# Patient Record
Sex: Female | Born: 1935 | Race: Black or African American | Hispanic: No
Health system: Southern US, Community
[De-identification: ages and names within clinical notes are randomized; demographics above are authoritative.]

## PROBLEM LIST (undated history)

## (undated) DIAGNOSIS — I63512 Cerebral infarction due to unspecified occlusion or stenosis of left middle cerebral artery: Secondary | ICD-10-CM

## (undated) DIAGNOSIS — G9340 Encephalopathy, unspecified: Secondary | ICD-10-CM

## (undated) DIAGNOSIS — F0390 Unspecified dementia without behavioral disturbance: Secondary | ICD-10-CM

## (undated) DIAGNOSIS — G40909 Epilepsy, unspecified, not intractable, without status epilepticus: Secondary | ICD-10-CM

## (undated) DIAGNOSIS — J9621 Acute and chronic respiratory failure with hypoxia: Secondary | ICD-10-CM

---

## 2020-06-17 ENCOUNTER — Other Ambulatory Visit (HOSPITAL_COMMUNITY): Payer: Medicare Other

## 2020-06-17 ENCOUNTER — Inpatient Hospital Stay
Admission: AD | Admit: 2020-06-17 | Discharge: 2020-07-10 | Disposition: A | Discharge status: Skilled Nursing Facility | Payer: Medicare Other | Source: Other Acute Inpatient Hospital | Attending: Internal Medicine | Admitting: Internal Medicine

## 2020-06-17 DIAGNOSIS — G9340 Encephalopathy, unspecified: Secondary | ICD-10-CM | POA: Diagnosis not present

## 2020-06-17 DIAGNOSIS — I63512 Cerebral infarction due to unspecified occlusion or stenosis of left middle cerebral artery: Secondary | ICD-10-CM | POA: Diagnosis not present

## 2020-06-17 DIAGNOSIS — J9690 Respiratory failure, unspecified, unspecified whether with hypoxia or hypercapnia: Secondary | ICD-10-CM

## 2020-06-17 DIAGNOSIS — F0390 Unspecified dementia without behavioral disturbance: Secondary | ICD-10-CM | POA: Diagnosis present

## 2020-06-17 DIAGNOSIS — G40909 Epilepsy, unspecified, not intractable, without status epilepticus: Secondary | ICD-10-CM

## 2020-06-17 DIAGNOSIS — J9621 Acute and chronic respiratory failure with hypoxia: Secondary | ICD-10-CM | POA: Diagnosis not present

## 2020-06-17 DIAGNOSIS — Z431 Encounter for attention to gastrostomy: Secondary | ICD-10-CM

## 2020-06-17 HISTORY — DX: Epilepsy, unspecified, not intractable, without status epilepticus: G40.909

## 2020-06-17 HISTORY — DX: Encephalopathy, unspecified: G93.40

## 2020-06-17 HISTORY — DX: Cerebral infarction due to unspecified occlusion or stenosis of left middle cerebral artery: I63.512

## 2020-06-17 HISTORY — DX: Acute and chronic respiratory failure with hypoxia: J96.21

## 2020-06-17 HISTORY — DX: Unspecified dementia, unspecified severity, without behavioral disturbance, psychotic disturbance, mood disturbance, and anxiety: F03.90

## 2020-06-17 LAB — BLOOD GAS, ARTERIAL
Acid-Base Excess: 2 mmol/L (ref 0.0–2.0)
Bicarbonate: 25.7 mmol/L (ref 20.0–28.0)
FIO2: 28
O2 Saturation: 98.3 %
Patient temperature: 37
pCO2 arterial: 38.4 mmHg (ref 32.0–48.0)
pH, Arterial: 7.441 (ref 7.350–7.450)
pO2, Arterial: 117 mmHg — ABNORMAL HIGH (ref 83.0–108.0)

## 2020-06-17 MED ORDER — IOHEXOL 300 MG/ML  SOLN
50.0000 mL | Freq: Once | INTRAMUSCULAR | Status: AC | PRN
  Administered 2020-06-17: 50 mL

## 2020-06-18 ENCOUNTER — Encounter: Payer: Self-pay | Admitting: Internal Medicine

## 2020-06-18 DIAGNOSIS — G9340 Encephalopathy, unspecified: Secondary | ICD-10-CM | POA: Diagnosis present

## 2020-06-18 DIAGNOSIS — I63512 Cerebral infarction due to unspecified occlusion or stenosis of left middle cerebral artery: Secondary | ICD-10-CM | POA: Diagnosis not present

## 2020-06-18 DIAGNOSIS — J9621 Acute and chronic respiratory failure with hypoxia: Secondary | ICD-10-CM | POA: Diagnosis not present

## 2020-06-18 DIAGNOSIS — F0390 Unspecified dementia without behavioral disturbance: Secondary | ICD-10-CM | POA: Diagnosis not present

## 2020-06-18 DIAGNOSIS — G40909 Epilepsy, unspecified, not intractable, without status epilepticus: Secondary | ICD-10-CM

## 2020-06-18 LAB — CBC
HCT: 22.3 % — ABNORMAL LOW (ref 36.0–46.0)
Hemoglobin: 7 g/dL — ABNORMAL LOW (ref 12.0–15.0)
MCH: 29.8 pg (ref 26.0–34.0)
MCHC: 31.4 g/dL (ref 30.0–36.0)
MCV: 94.9 fL (ref 80.0–100.0)
Platelets: 394 10*3/uL (ref 150–400)
RBC: 2.35 MIL/uL — ABNORMAL LOW (ref 3.87–5.11)
RDW: 14.8 % (ref 11.5–15.5)
WBC: 6.2 10*3/uL (ref 4.0–10.5)
nRBC: 0 % (ref 0.0–0.2)

## 2020-06-18 LAB — COMPREHENSIVE METABOLIC PANEL
ALT: 21 U/L (ref 0–44)
AST: 32 U/L (ref 15–41)
Albumin: 2 g/dL — ABNORMAL LOW (ref 3.5–5.0)
Alkaline Phosphatase: 47 U/L (ref 38–126)
Anion gap: 9 (ref 5–15)
BUN: 22 mg/dL (ref 8–23)
CO2: 28 mmol/L (ref 22–32)
Calcium: 9 mg/dL (ref 8.9–10.3)
Chloride: 96 mmol/L — ABNORMAL LOW (ref 98–111)
Creatinine, Ser: 0.5 mg/dL (ref 0.44–1.00)
GFR calc Af Amer: >60 mL/min (ref >60)
GFR calc non Af Amer: >60 mL/min (ref >60)
Glucose, Bld: 110 mg/dL — ABNORMAL HIGH (ref 70–99)
Potassium: 4.5 mmol/L (ref 3.5–5.1)
Sodium: 133 mmol/L — ABNORMAL LOW (ref 135–145)
Total Bilirubin: 0.8 mg/dL (ref 0.3–1.2)
Total Protein: 5.5 g/dL — ABNORMAL LOW (ref 6.5–8.1)

## 2020-06-18 LAB — PROTIME-INR
INR: 1.1 (ref 0.8–1.2)
Prothrombin Time: 13.8 seconds (ref 11.4–15.2)

## 2020-06-18 LAB — TSH: TSH: 3.199 u[IU]/mL (ref 0.350–4.500)

## 2020-06-18 NOTE — Progress Notes (Addendum)
Pulmonary Critical Care Medicine [facility] [place]   PULMONARY CRITICAL CARE SERVICE  PROGRESS NOTE  Date of Service: 06/18/2020  Michaelina Blandino  ZOX:[id number]  DOB: [birth year]   DOA: 06/17/2020  Referring Physician: Carron Curie, MD  HPI: Joy Norman is a 86 y.o. female seen for follow up of Acute on Chronic Respiratory Failure.  Patient remains on full support at this time pressure control mode rate of 12 and FiO2 of 28% satting well no fever distress.  Medications: Reviewed on Rounds  Physical Exam:  Vitals: Pulse 67 respirations 19 BP 107/42 O2 sat 90% temp 97  Ventilator Settings ventilator mode AC PC rate of 12 is 20 pressure 17 PEEP of 5 and FiO2 20%  . General: Comfortable at this time . Eyes: Grossly normal lids, irises & conjunctiva . ENT: grossly tongue is normal . Neck: no obvious mass . Cardiovascular: S1 S2 normal no gallop . Respiratory: Coarse breath sounds . Abdomen: soft . Skin: no rash seen on limited exam . Musculoskeletal: not rigid . Psychiatric:unable to assess . Neurologic: no seizure no involuntary movements         Lab Data:   Basic Metabolic Panel: Recent Labs  Lab 06/18/20 0806  NA 133*  K 4.5  CL 96*  CO2 28  GLUCOSE 110*  BUN 22  CREATININE 0.50  CALCIUM 9.0    ABG: Recent Labs  Lab 06/17/20 1345  PHART 7.441  PCO2ART 38.4  PO2ART 117*  HCO3 25.7  O2SAT 98.3    Liver Function Tests: Recent Labs  Lab 06/18/20 0806  AST 32  ALT 21  ALKPHOS 47  BILITOT 0.8  PROT 5.5*  ALBUMIN 2.0*   No results for input(s): LIPASE, AMYLASE in the last 168 hours. No results for input(s): AMMONIA in the last 168 hours.  CBC: Recent Labs  Lab 06/18/20 0806  WBC 6.2  HGB 7.0*  HCT 22.3*  MCV 94.9  PLT 394    Cardiac Enzymes: No results for input(s): CKTOTAL, CKMB, CKMBINDEX, TROPONINI in the last 168 hours.  BNP (last 3 results) No results for input(s): BNP in the last 8760 hours.  ProBNP (last 3  results) No results for input(s): PROBNP in the last 8760 hours.  Radiological Exams: DG ABDOMEN PEG TUBE LOCATION  Result Date: 06/17/2020 CLINICAL DATA:  Status post replacement of a PEG tube. EXAM: ABDOMEN - 1 VIEW COMPARISON:  None. FINDINGS: Contrast injected into the patient's PEG tube opacifies the stomach. IMPRESSION: Peg tube in good position. Electronically Signed   By: Drusilla Kanner M.D.   On: 06/17/2020 15:54   DG CHEST PORT 1 VIEW  Result Date: 06/17/2020 CLINICAL DATA:  Peg tube placement. EXAM: PORTABLE CHEST 1 VIEW COMPARISON:  June 01, 2020 FINDINGS: Mildly decreased lung volumes are seen which is likely secondary to the degree of patient inspiration. The endotracheal tube and nasogastric tube seen on the prior study have been removed. A tracheostomy tube is in place. A right-sided PICC line is noted with its distal end seen at the junction of the superior vena cava and right atrium. There is no evidence of acute infiltrate, pleural effusion or pneumothorax. The heart size and mediastinal contours are within normal limits. There is moderate severity calcification of the aortic arch. The visualized skeletal structures are unremarkable. IMPRESSION: 1. No acute cardiopulmonary disease. 2. Tracheostomy tube and right-sided PICC line in place, as described above. Electronically Signed   By: Aram Candela M.D.   On: 06/17/2020 15:55  Assessment/Plan Active Problems:   Acute on chronic respiratory failure with hypoxia (HCC)   Arterial ischemic stroke, MCA (middle cerebral artery), left, acute (HCC)   Dementia (HCC)   Seizure disorder (HCC)   Acute encephalopathy   1. Acute on chronic respiratory failure with hypoxia at this time patient is on the ventilator and full support. We will continue to attempt weaning at this time. Continue supportive measures and aggressive pulmonary toilet. 2. Left MCA stroke no change we will continue to follow along.  Patient has been  essentially nonverbal nonresponsive. 3. Acute encephalopathy likely related to multiple factors including respiratory failure as well as the stroke.  We will continue with supportive care. 4. Dementia at baseline patient has a history of normal pressure hydrocephalus along with psychotic depression.  Plan is for him to be to continue with supportive care. 5. Seizure disorder no active seizure noted at this time we will continue to monitor   I have personally seen and evaluated the patient, evaluated laboratory and imaging results, formulated the assessment and plan and placed orders. The Patient requires high complexity decision making with multiple systems involvement.  Rounds were done with the Respiratory Therapy Director and Staff therapists and discussed with nursing staff also.  Yevonne Pax, MD [facility] Pulmonary Critical Care Medicine Sleep Medicine

## 2020-06-18 NOTE — Consult Note (Signed)
Pulmonary Critical Care Medicine [facility] [place]  PULMONARY SERVICE  Date of Service: 06/17/2020  PULMONARY CRITICAL CARE Joy Norman  [id number]  DOB: [birth year]   DOA: 06/17/2020  Referring Physician: Carron Curie, MD  HPI: Joy Norman is a 86 y.o. female seen for follow up of Acute on Chronic Respiratory Failure.  Patient has multiple medical problems including hyperlipidemia hypertension depression dementia delusional disorder with psychotic symptoms bradycardia hydrocephalus seizure disorder came into the hospital from a nursing home facility with altered mental status.  The patient had normally been interactive and alert and oriented but became disoriented so they sent her for evaluation.  The patient was admitted to the hospital had respiratory failure had a work-up to evaluate.  Other complications included development of urinary tract infection which was treated.  Patient also was found to have pneumonia which was treated.  Patient was also found to have an acute stroke and was outside the window for TPA so was managed conservatively.  Basically has not been responsive  Review of Systems:  ROS performed and is unremarkable other than noted above.  Past Medical History Past Medical History:  Diagnosis Date  . CA - breast cancer  . Glaucoma  Bilateral  . Hyperlipidemia  . Hypertension  . Hypothyroidism  . Osteoporosis   Past Surgical History Past Surgical History:  Procedure Laterality Date  . ABDOMINAL SURGERY  . Argon Laser Trabeculoplasty OU 2008  . BREAST SURGERY  . CATARACT EXTRACTION  Bilateral  . EYE SURGERY  . HYSTERECTOMY  . Mastectomy, left breast  . TRABECULECTOMY 10/18/2011  Procedure: TRABECULECTOMY; Surgeon: Tilden Dome, MD; Location: [facility] OUTPATIENT OR; Service: Ophthalmology; Laterality: Right;   Family History Family History  Problem Relation Age of Onset  . Glaucoma Mother  "blindness due to it"  . Cataracts  Mother  . Cataracts Sister  . Heart attack Sister  . Hypertension Sister   Social History Social History   Socioeconomic History  . Marital status: Widowed  Spouse name: Not on file  . Number of children: Not on file  . Years of education: Not on file  . Highest education level: Not on file  Occupational History  . Not on file  Tobacco Use  . Smoking status: Never Smoker  . Smokeless tobacco: Never Used  Substance and Sexual Activity  . Alcohol use: No  . Drug use: No  . Sexual activity: Not on file  Other Topics Concern  . Not on file  Social History Narrative  . Not on file   Medications: Reviewed on Rounds  Physical Exam:  Vitals: Temperature is 98.0 pulse 75 respiratory 14 blood pressure is 143/74 saturations 94%  Ventilator Settings on pressure assist control FiO2 28% tidal volume 457 IP 17 PEEP 5  . General: Comfortable at this time . Eyes: Grossly normal lids, irises & conjunctiva . ENT: grossly tongue is normal . Neck: no obvious mass . Cardiovascular: S1-S2 normal no gallop or rub . Respiratory: No rhonchi very coarse breath sounds . Abdomen: Soft and nontender . Skin: no rash seen on limited exam . Musculoskeletal: not rigid . Psychiatric:unable to assess . Neurologic: no seizure no involuntary movements         Labs on Admission:  Basic Metabolic Panel: Recent Labs  Lab 06/18/20 0806  NA 133*  K 4.5  CL 96*  CO2 28  GLUCOSE 110*  BUN 22  CREATININE 0.50  CALCIUM 9.0    Recent Labs  Lab 06/17/20  1345  PHART 7.441  PCO2ART 38.4  PO2ART 117*  HCO3 25.7  O2SAT 98.3    Liver Function Tests: Recent Labs  Lab 06/18/20 0806  AST 32  ALT 21  ALKPHOS 47  BILITOT 0.8  PROT 5.5*  ALBUMIN 2.0*   No results for input(s): LIPASE, AMYLASE in the last 168 hours. No results for input(s): AMMONIA in the last 168 hours.  CBC: Recent Labs  Lab 06/18/20 0806  WBC 6.2  HGB 7.0*  HCT 22.3*  MCV 94.9  PLT 394    Cardiac  Enzymes: No results for input(s): CKTOTAL, CKMB, CKMBINDEX, TROPONINI in the last 168 hours.  BNP (last 3 results) No results for input(s): BNP in the last 8760 hours.  ProBNP (last 3 results) No results for input(s): PROBNP in the last 8760 hours.   Radiological Exams on Admission: DG ABDOMEN PEG TUBE LOCATION  Result Date: 06/17/2020 CLINICAL DATA:  Status post replacement of a PEG tube. EXAM: ABDOMEN - 1 VIEW COMPARISON:  None. FINDINGS: Contrast injected into the patient's PEG tube opacifies the stomach. IMPRESSION: Peg tube in good position. Electronically Signed   By: Drusilla Kanner M.D.   On: 06/17/2020 15:54   DG CHEST PORT 1 VIEW  Result Date: 06/17/2020 CLINICAL DATA:  Peg tube placement. EXAM: PORTABLE CHEST 1 VIEW COMPARISON:  June 01, 2020 FINDINGS: Mildly decreased lung volumes are seen which is likely secondary to the degree of patient inspiration. The endotracheal tube and nasogastric tube seen on the prior study have been removed. A tracheostomy tube is in place. A right-sided PICC line is noted with its distal end seen at the junction of the superior vena cava and right atrium. There is no evidence of acute infiltrate, pleural effusion or pneumothorax. The heart size and mediastinal contours are within normal limits. There is moderate severity calcification of the aortic arch. The visualized skeletal structures are unremarkable. IMPRESSION: 1. No acute cardiopulmonary disease. 2. Tracheostomy tube and right-sided PICC line in place, as described above. Electronically Signed   By: Aram Candela M.D.   On: 06/17/2020 15:55    Assessment/Plan Active Problems:   Acute on chronic respiratory failure with hypoxia (HCC)   Arterial ischemic stroke, MCA (middle cerebral artery), left, acute (HCC)   Dementia (HCC)   Seizure disorder (HCC)   Acute encephalopathy   1. Acute on chronic respiratory failure with hypoxia at this time patient is on the ventilator and full  support.  Mechanics have not been very acute as far as being able to wean.  Plan is going to be to continue with higher support titrate oxygen and titrate the pressures down as tolerated.  Essentially patient remains unresponsive 2. Left MCA stroke no change we will continue to follow along.  Patient has been essentially nonverbal nonresponsive. 3. Acute encephalopathy likely related to multiple factors including respiratory failure as well as the stroke.  We will continue with supportive care. 4. Dementia at baseline patient has a history of normal pressure hydrocephalus along with psychotic depression.  Plan is for him to be to continue with supportive care. 5. Seizure disorder no active seizure noted at this time we will continue to monitor  I have personally seen and evaluated the patient, evaluated laboratory and imaging results, formulated the assessment and plan and placed orders. The Patient requires high complexity decision making with multiple systems involvement.  Case was discussed on Rounds with the Respiratory Therapy Director and the Respiratory staff Time Spent  Parlee Amescua A  Humphrey Rolls, MD [facility] Pulmonary Critical Care Medicine Sleep Medicine

## 2020-06-19 DIAGNOSIS — F0390 Unspecified dementia without behavioral disturbance: Secondary | ICD-10-CM | POA: Diagnosis not present

## 2020-06-19 DIAGNOSIS — G9340 Encephalopathy, unspecified: Secondary | ICD-10-CM | POA: Diagnosis not present

## 2020-06-19 DIAGNOSIS — J9621 Acute and chronic respiratory failure with hypoxia: Secondary | ICD-10-CM | POA: Diagnosis not present

## 2020-06-19 DIAGNOSIS — I63512 Cerebral infarction due to unspecified occlusion or stenosis of left middle cerebral artery: Secondary | ICD-10-CM | POA: Diagnosis not present

## 2020-06-19 NOTE — Progress Notes (Addendum)
Pulmonary Critical Care Medicine [facility] [place]   PULMONARY CRITICAL CARE SERVICE  PROGRESS NOTE  Date of Service: 06/19/2020  Joy Norman  [id number]  DOB: 1928-10-12   DOA: 06/17/2020  Referring Physician: Carron Curie, MD  HPI: Joy Norman is a 86 y.o. female seen for follow up of Acute on Chronic Respiratory Failure. Patient remains on full support pressure control mode rate of 12 with an FiO2 of 28% satting well no fever distress at this time.  Medications: Reviewed on Rounds  Physical Exam:  Vitals: Pulse 78 respirations 18 BP 124/58 O2 sat 100% temp 96.5  Ventilator Settings ventilator mode AC PC rate of 12 inspiratory pressure 17 PEEP of five and FiO2 of 28%  . General: Comfortable at this time . Eyes: Grossly normal lids, irises & conjunctiva . ENT: grossly tongue is normal . Neck: no obvious mass . Cardiovascular: S1 S2 normal no gallop . Respiratory: Coarse breath sounds . Abdomen: soft . Skin: no rash seen on limited exam . Musculoskeletal: not rigid . Psychiatric:unable to assess . Neurologic: no seizure no involuntary movements         Lab Data:   Basic Metabolic Panel: Recent Labs  Lab 06/18/20 0806  NA 133*  K 4.5  CL 96*  CO2 28  GLUCOSE 110*  BUN 22  CREATININE 0.50  CALCIUM 9.0    ABG: Recent Labs  Lab 06/17/20 1345  PHART 7.441  PCO2ART 38.4  PO2ART 117*  HCO3 25.7  O2SAT 98.3    Liver Function Tests: Recent Labs  Lab 06/18/20 0806  AST 32  ALT 21  ALKPHOS 47  BILITOT 0.8  PROT 5.5*  ALBUMIN 2.0*   No results for input(s): LIPASE, AMYLASE in the last 168 hours. No results for input(s): AMMONIA in the last 168 hours.  CBC: Recent Labs  Lab 06/18/20 0806  WBC 6.2  HGB 7.0*  HCT 22.3*  MCV 94.9  PLT 394    Cardiac Enzymes: No results for input(s): CKTOTAL, CKMB, CKMBINDEX, TROPONINI in the last 168 hours.  BNP (last 3 results) No results for input(s): BNP in the last 8760 hours.  ProBNP  (last 3 results) No results for input(s): PROBNP in the last 8760 hours.  Radiological Exams: DG ABDOMEN PEG TUBE LOCATION  Result Date: 06/17/2020 CLINICAL DATA:  Status post replacement of a PEG tube. EXAM: ABDOMEN - 1 VIEW COMPARISON:  None. FINDINGS: Contrast injected into the patient's PEG tube opacifies the stomach. IMPRESSION: Peg tube in good position. Electronically Signed   By: Drusilla Kanner M.D.   On: 06/17/2020 15:54   DG CHEST PORT 1 VIEW  Result Date: 06/17/2020 CLINICAL DATA:  Peg tube placement. EXAM: PORTABLE CHEST 1 VIEW COMPARISON:  June 01, 2020 FINDINGS: Mildly decreased lung volumes are seen which is likely secondary to the degree of patient inspiration. The endotracheal tube and nasogastric tube seen on the prior study have been removed. A tracheostomy tube is in place. A right-sided PICC line is noted with its distal end seen at the junction of the superior vena cava and right atrium. There is no evidence of acute infiltrate, pleural effusion or pneumothorax. The heart size and mediastinal contours are within normal limits. There is moderate severity calcification of the aortic arch. The visualized skeletal structures are unremarkable. IMPRESSION: 1. No acute cardiopulmonary disease. 2. Tracheostomy tube and right-sided PICC line in place, as described above. Electronically Signed   By: Aram Candela M.D.   On: 06/17/2020 15:55  Assessment/Plan Active Problems:   Acute on chronic respiratory failure with hypoxia (HCC)   Arterial ischemic stroke, MCA (middle cerebral artery), left, acute (HCC)   Dementia (HCC)   Seizure disorder (HCC)   Acute encephalopathy   1. Acute on chronic respiratory failure with hypoxia patient mains on full support pressure control mode at this time currently requiring an FiO2 of 28% patient remains essentially unresponsive continue supportive measures pulmonary toilet. 2. Left MCA stroke no change we will continue to follow along.   Patient has been essentially nonverbal nonresponsive. 3. Acute encephalopathy likely related to multiple factors including respiratory failure as well as the stroke.  We will continue with supportive care. 4. Dementia at baseline patient has a history of normal pressure hydrocephalus along with psychotic depression.  Plan is for him to be to continue with supportive care. 5. Seizure disorder no active seizure noted at this time we will continue to monitor   I have personally seen and evaluated the patient, evaluated laboratory and imaging results, formulated the assessment and plan and placed orders. The Patient requires high complexity decision making with multiple systems involvement.  Rounds were done with the Respiratory Therapy Director and Staff therapists and discussed with nursing staff also.  Yevonne Pax, MD [facility] Pulmonary Critical Care Medicine Sleep Medicine

## 2020-06-20 DIAGNOSIS — J9621 Acute and chronic respiratory failure with hypoxia: Secondary | ICD-10-CM | POA: Diagnosis not present

## 2020-06-20 DIAGNOSIS — F0390 Unspecified dementia without behavioral disturbance: Secondary | ICD-10-CM | POA: Diagnosis not present

## 2020-06-20 DIAGNOSIS — I63512 Cerebral infarction due to unspecified occlusion or stenosis of left middle cerebral artery: Secondary | ICD-10-CM | POA: Diagnosis not present

## 2020-06-20 DIAGNOSIS — G9340 Encephalopathy, unspecified: Secondary | ICD-10-CM | POA: Diagnosis not present

## 2020-06-20 NOTE — Progress Notes (Addendum)
Pulmonary Critical Care Medicine [facility] [place]   PULMONARY CRITICAL CARE SERVICE  PROGRESS NOTE  Date of Service: 06/20/2020  Joy Norman  ELF:[id number]  DOB: 12-19-1927   DOA: 06/17/2020  Referring Physician: Carron Curie, MD  HPI: Joy Norman is a 87 y.o. female seen for follow up of Acute on Chronic Respiratory Failure.  Patient has a PSV goal today of 2 hours currently on 28% FiO2 satting well no distress.  Medications: Reviewed on Rounds  Physical Exam:  Vitals: Pulse 78 respirations 15 BP 120/71 O2 sat 100% temp 98.6  Ventilator Settings pressure support 12/5 FiO2 28%  . General: Comfortable at this time . Eyes: Grossly normal lids, irises & conjunctiva . ENT: grossly tongue is normal . Neck: no obvious mass . Cardiovascular: S1 S2 normal no gallop . Respiratory: No rales or rhonchi noted . Abdomen: soft . Skin: no rash seen on limited exam . Musculoskeletal: not rigid . Psychiatric:unable to assess . Neurologic: no seizure no involuntary movements         Lab Data:   Basic Metabolic Panel: Recent Labs  Lab 06/18/20 0806  NA 133*  K 4.5  CL 96*  CO2 28  GLUCOSE 110*  BUN 22  CREATININE 0.50  CALCIUM 9.0    ABG: Recent Labs  Lab 06/17/20 1345  PHART 7.441  PCO2ART 38.4  PO2ART 117*  HCO3 25.7  O2SAT 98.3    Liver Function Tests: Recent Labs  Lab 06/18/20 0806  AST 32  ALT 21  ALKPHOS 47  BILITOT 0.8  PROT 5.5*  ALBUMIN 2.0*   No results for input(s): LIPASE, AMYLASE in the last 168 hours. No results for input(s): AMMONIA in the last 168 hours.  CBC: Recent Labs  Lab 06/18/20 0806  WBC 6.2  HGB 7.0*  HCT 22.3*  MCV 94.9  PLT 394    Cardiac Enzymes: No results for input(s): CKTOTAL, CKMB, CKMBINDEX, TROPONINI in the last 168 hours.  BNP (last 3 results) No results for input(s): BNP in the last 8760 hours.  ProBNP (last 3 results) No results for input(s): PROBNP in the last 8760 hours.  Radiological  Exams: No results found.  Assessment/Plan Active Problems:   Acute on chronic respiratory failure with hypoxia (HCC)   Arterial ischemic stroke, MCA (middle cerebral artery), left, acute (HCC)   Dementia (HCC)   Seizure disorder (HCC)   Acute encephalopathy   1. Acute on chronic respiratory failure with hypoxia  patient will continue on pressure support 28% FiO2.  Remains unresponsive at this time.  Continue supportive measures and pulmonary toilet. 2. Left MCA stroke no change we will continue to follow along. Patient has been essentially nonverbal nonresponsive. 3. Acute encephalopathy likely related to multiple factors including respiratory failure as well as the stroke. We will continue with supportive care. 4. Dementia at baseline patient has a history of normal pressure hydrocephalus along with psychotic depression. Plan is for him to be to continue with supportive care. 5. Seizure disorder no active seizure noted at this time we will continue to monitor   I have personally seen and evaluated the patient, evaluated laboratory and imaging results, formulated the assessment and plan and placed orders. The Patient requires high complexity decision making with multiple systems involvement.  Rounds were done with the Respiratory Therapy Director and Staff therapists and discussed with nursing staff also.  Yevonne Pax, MD [facility] Pulmonary Critical Care Medicine Sleep Medicine

## 2020-06-21 DIAGNOSIS — J9621 Acute and chronic respiratory failure with hypoxia: Secondary | ICD-10-CM | POA: Diagnosis not present

## 2020-06-21 DIAGNOSIS — I63512 Cerebral infarction due to unspecified occlusion or stenosis of left middle cerebral artery: Secondary | ICD-10-CM | POA: Diagnosis not present

## 2020-06-21 DIAGNOSIS — F0390 Unspecified dementia without behavioral disturbance: Secondary | ICD-10-CM | POA: Diagnosis not present

## 2020-06-21 DIAGNOSIS — G9340 Encephalopathy, unspecified: Secondary | ICD-10-CM | POA: Diagnosis not present

## 2020-06-21 NOTE — Progress Notes (Addendum)
Pulmonary Critical Care Medicine [facility] [place]   PULMONARY CRITICAL CARE SERVICE  PROGRESS NOTE  Date of Service: 06/21/2020  Joy Norman  [id number]  DOB: January 15, 1928   DOA: 06/17/2020  Referring Physician: Carron Curie, MD  HPI: Joy Norman is a 86 y.o. female seen for follow up of Acute on Chronic Respiratory Failure.  Patient continues on pressure support with an FiO2 of 28% for an 8-hour goal today currently satting well with no fever or distress  Medications: Reviewed on Rounds  Physical Exam:  Vitals: Pulse 67 respirations 16 BP 108/63 O2 sat 100% temp 99.2  Ventilator Settings ventilator mode pressure support 12/5 FiO2 28%  . General: Comfortable at this time . Eyes: Grossly normal lids, irises & conjunctiva . ENT: grossly tongue is normal . Neck: no obvious mass . Cardiovascular: S1 S2 normal no gallop . Respiratory: No rales or rhonchi noted . Abdomen: soft . Skin: no rash seen on limited exam . Musculoskeletal: not rigid . Psychiatric:unable to assess . Neurologic: no seizure no involuntary movements         Lab Data:   Basic Metabolic Panel: Recent Labs  Lab 06/18/20 0806  NA 133*  K 4.5  CL 96*  CO2 28  GLUCOSE 110*  BUN 22  CREATININE 0.50  CALCIUM 9.0    ABG: Recent Labs  Lab 06/17/20 1345  PHART 7.441  PCO2ART 38.4  PO2ART 117*  HCO3 25.7  O2SAT 98.3    Liver Function Tests: Recent Labs  Lab 06/18/20 0806  AST 32  ALT 21  ALKPHOS 47  BILITOT 0.8  PROT 5.5*  ALBUMIN 2.0*   No results for input(s): LIPASE, AMYLASE in the last 168 hours. No results for input(s): AMMONIA in the last 168 hours.  CBC: Recent Labs  Lab 06/18/20 0806  WBC 6.2  HGB 7.0*  HCT 22.3*  MCV 94.9  PLT 394    Cardiac Enzymes: No results for input(s): CKTOTAL, CKMB, CKMBINDEX, TROPONINI in the last 168 hours.  BNP (last 3 results) No results for input(s): BNP in the last 8760 hours.  ProBNP (last 3 results) No results for  input(s): PROBNP in the last 8760 hours.  Radiological Exams: No results found.  Assessment/Plan Active Problems:   Acute on chronic respiratory failure with hypoxia (HCC)   Arterial ischemic stroke, MCA (middle cerebral artery), left, acute (HCC)   Dementia (HCC)   Seizure disorder (HCC)   Acute encephalopathy   1. Acute on chronic respiratory failure with hypoxia patient will continue on pressure support 28% FiO2.  Remains unresponsive at this time.  Continue supportive measures and pulmonary toilet. 2. Left MCA stroke no change we will continue to follow along. Patient has been essentially nonverbal nonresponsive. 3. Acute encephalopathy likely related to multiple factors including respiratory failure as well as the stroke. We will continue with supportive care. 4. Dementia at baseline patient has a history of normal pressure hydrocephalus along with psychotic depression. Plan is for him to be to continue with supportive care. 5. Seizure disorder no active seizure noted at this time we will continue to monitor   I have personally seen and evaluated the patient, evaluated laboratory and imaging results, formulated the assessment and plan and placed orders. The Patient requires high complexity decision making with multiple systems involvement.  Rounds were done with the Respiratory Therapy Director and Staff therapists and discussed with nursing staff also.  Yevonne Pax, MD [facility] Pulmonary Critical Care Medicine Sleep Medicine

## 2020-06-22 ENCOUNTER — Other Ambulatory Visit (HOSPITAL_COMMUNITY): Payer: Medicare Other

## 2020-06-22 DIAGNOSIS — G9340 Encephalopathy, unspecified: Secondary | ICD-10-CM | POA: Diagnosis not present

## 2020-06-22 DIAGNOSIS — F0390 Unspecified dementia without behavioral disturbance: Secondary | ICD-10-CM | POA: Diagnosis not present

## 2020-06-22 DIAGNOSIS — I63512 Cerebral infarction due to unspecified occlusion or stenosis of left middle cerebral artery: Secondary | ICD-10-CM | POA: Diagnosis not present

## 2020-06-22 DIAGNOSIS — J9621 Acute and chronic respiratory failure with hypoxia: Secondary | ICD-10-CM | POA: Diagnosis not present

## 2020-06-22 LAB — CBC
HCT: 24.1 % — ABNORMAL LOW (ref 36.0–46.0)
Hemoglobin: 7.4 g/dL — ABNORMAL LOW (ref 12.0–15.0)
MCH: 29.2 pg (ref 26.0–34.0)
MCHC: 30.7 g/dL (ref 30.0–36.0)
MCV: 95.3 fL (ref 80.0–100.0)
Platelets: 392 10*3/uL (ref 150–400)
RBC: 2.53 MIL/uL — ABNORMAL LOW (ref 3.87–5.11)
RDW: 15.5 % (ref 11.5–15.5)
WBC: 7.8 10*3/uL (ref 4.0–10.5)
nRBC: 0.3 % — ABNORMAL HIGH (ref 0.0–0.2)

## 2020-06-22 LAB — BASIC METABOLIC PANEL
Anion gap: 11 (ref 5–15)
BUN: 28 mg/dL — ABNORMAL HIGH (ref 8–23)
CO2: 26 mmol/L (ref 22–32)
Calcium: 9.1 mg/dL (ref 8.9–10.3)
Chloride: 96 mmol/L — ABNORMAL LOW (ref 98–111)
Creatinine, Ser: 0.56 mg/dL (ref 0.44–1.00)
GFR calc Af Amer: >60 mL/min (ref >60)
GFR calc non Af Amer: >60 mL/min (ref >60)
Glucose, Bld: 99 mg/dL (ref 70–99)
Potassium: 5.1 mmol/L (ref 3.5–5.1)
Sodium: 133 mmol/L — ABNORMAL LOW (ref 135–145)

## 2020-06-22 NOTE — Progress Notes (Signed)
Pulmonary Critical Care Medicine [facility] [place]   PULMONARY CRITICAL CARE SERVICE  PROGRESS NOTE  Date of Service: 06/22/2020  Joy Norman  ALP:[id number]  DOB: Jun 25, 1928   DOA: 06/17/2020  Referring Physician: Carron Curie, MD  HPI: Joy Norman is a 86 y.o. female seen for follow up of Acute on Chronic Respiratory Failure.  Patient currently is on pressure support mode has been on 28% FiO2 currently is on 12/5 the goal is for 12-hour weaning  Medications: Reviewed on Rounds  Physical Exam:  Vitals: Temperature 98.7 pulse 77 respiratory rate is 14 blood pressure is 130/74 saturations 97%  Ventilator Settings on pressure support FiO2 is 28% pressure poor 12/5 goal of 12 hours  . General: Comfortable at this time . Eyes: Grossly normal lids, irises & conjunctiva . ENT: grossly tongue is normal . Neck: no obvious mass . Cardiovascular: S1 S2 normal no gallop . Respiratory: No rhonchi very coarse breath sounds . Abdomen: soft . Skin: no rash seen on limited exam . Musculoskeletal: not rigid . Psychiatric:unable to assess . Neurologic: no seizure no involuntary movements         Lab Data:   Basic Metabolic Panel: Recent Labs  Lab 06/18/20 0806  NA 133*  K 4.5  CL 96*  CO2 28  GLUCOSE 110*  BUN 22  CREATININE 0.50  CALCIUM 9.0    ABG: Recent Labs  Lab 06/17/20 1345  PHART 7.441  PCO2ART 38.4  PO2ART 117*  HCO3 25.7  O2SAT 98.3    Liver Function Tests: Recent Labs  Lab 06/18/20 0806  AST 32  ALT 21  ALKPHOS 47  BILITOT 0.8  PROT 5.5*  ALBUMIN 2.0*   No results for input(s): LIPASE, AMYLASE in the last 168 hours. No results for input(s): AMMONIA in the last 168 hours.  CBC: Recent Labs  Lab 06/18/20 0806  WBC 6.2  HGB 7.0*  HCT 22.3*  MCV 94.9  PLT 394    Cardiac Enzymes: No results for input(s): CKTOTAL, CKMB, CKMBINDEX, TROPONINI in the last 168 hours.  BNP (last 3 results) No results for input(s): BNP in the last  8760 hours.  ProBNP (last 3 results) No results for input(s): PROBNP in the last 8760 hours.  Radiological Exams: DG CHEST PORT 1 VIEW  Result Date: 06/22/2020 CLINICAL DATA:  Respiratory failure. EXAM: PORTABLE CHEST 1 VIEW COMPARISON:  06/17/2020 FINDINGS: Tracheostomy tube tip is identified above the carina. Asymmetric elevation of the right hemidiaphragm. Heart size is normal. Aortic atherosclerotic calcifications noted. Hiatal hernia. No pleural effusion or airspace consolidation. IMPRESSION: 1. No acute cardiopulmonary abnormalities. 2. Hiatal hernia. 3.  Aortic Atherosclerosis (ICD10-I70.0). Electronically Signed   By: Signa Kell M.D.   On: 06/22/2020 07:48    Assessment/Plan Active Problems:   Acute on chronic respiratory failure with hypoxia (HCC)   Arterial ischemic stroke, MCA (middle cerebral artery), left, acute (HCC)   Dementia (HCC)   Seizure disorder (HCC)   Acute encephalopathy   1. Acute on chronic respiratory failure with hypoxia patient currently is on pressure support again weaning goal is 12 hours today 2. Acute stroke no change we will continue with supportive care 3. Dementia unchanged 4. Seizure disorder there has been no active seizures noted. 5. Acute encephalopathy we will continue to monitor secondary to the stroke   I have personally seen and evaluated the patient, evaluated laboratory and imaging results, formulated the assessment and plan and placed orders. The Patient requires high complexity decision making with multiple  systems involvement.  Rounds were done with the Respiratory Therapy Director and Staff therapists and discussed with nursing staff also.  Allyne Gee, MD [facility] Pulmonary Critical Care Medicine Sleep Medicine

## 2020-06-23 DIAGNOSIS — G9340 Encephalopathy, unspecified: Secondary | ICD-10-CM | POA: Diagnosis not present

## 2020-06-23 DIAGNOSIS — F0390 Unspecified dementia without behavioral disturbance: Secondary | ICD-10-CM | POA: Diagnosis not present

## 2020-06-23 DIAGNOSIS — I63512 Cerebral infarction due to unspecified occlusion or stenosis of left middle cerebral artery: Secondary | ICD-10-CM | POA: Diagnosis not present

## 2020-06-23 DIAGNOSIS — J9621 Acute and chronic respiratory failure with hypoxia: Secondary | ICD-10-CM | POA: Diagnosis not present

## 2020-06-23 NOTE — Progress Notes (Addendum)
Pulmonary Critical Care Medicine [facility] [place]   PULMONARY CRITICAL CARE SERVICE  PROGRESS NOTE  Date of Service: 06/23/2020  Joy Norman  NKN:[id number]  DOB: [birth year]   DOA: 06/17/2020  Referring Physician: Carron Curie, MD  HPI: Joy Norman is a 86 y.o. female seen for follow up of Acute on Chronic Respiratory Failure.  Patient remains on pressure support at this time 20% FiO2 for 16-hour goal currently satting well no distress.  Medications: Reviewed on Rounds  Physical Exam:  Vitals: Pulse 74 respirations 26 BP 119/65 O2 sat 100% temp 97.5  Ventilator Settings pressure support 12/5 FiO2 28%  . General: Comfortable at this time . Eyes: Grossly normal lids, irises & conjunctiva . ENT: grossly tongue is normal . Neck: no obvious mass . Cardiovascular: S1 S2 normal no gallop . Respiratory: No rales or rhonchi noted . Abdomen: soft . Skin: no rash seen on limited exam . Musculoskeletal: not rigid . Psychiatric:unable to assess . Neurologic: no seizure no involuntary movements         Lab Data:   Basic Metabolic Panel: Recent Labs  Lab 06/18/20 0806 06/22/20 0843  NA 133* 133*  K 4.5 5.1  CL 96* 96*  CO2 28 26  GLUCOSE 110* 99  BUN 22 28*  CREATININE 0.50 0.56  CALCIUM 9.0 9.1    ABG: Recent Labs  Lab 06/17/20 1345  PHART 7.441  PCO2ART 38.4  PO2ART 117*  HCO3 25.7  O2SAT 98.3    Liver Function Tests: Recent Labs  Lab 06/18/20 0806  AST 32  ALT 21  ALKPHOS 47  BILITOT 0.8  PROT 5.5*  ALBUMIN 2.0*   No results for input(s): LIPASE, AMYLASE in the last 168 hours. No results for input(s): AMMONIA in the last 168 hours.  CBC: Recent Labs  Lab 06/18/20 0806 06/22/20 0843  WBC 6.2 7.8  HGB 7.0* 7.4*  HCT 22.3* 24.1*  MCV 94.9 95.3  PLT 394 392    Cardiac Enzymes: No results for input(s): CKTOTAL, CKMB, CKMBINDEX, TROPONINI in the last 168 hours.  BNP (last 3 results) No results for input(s): BNP in the last 8760  hours.  ProBNP (last 3 results) No results for input(s): PROBNP in the last 8760 hours.  Radiological Exams: DG CHEST PORT 1 VIEW  Result Date: 06/22/2020 CLINICAL DATA:  Respiratory failure. EXAM: PORTABLE CHEST 1 VIEW COMPARISON:  06/17/2020 FINDINGS: Tracheostomy tube tip is identified above the carina. Asymmetric elevation of the right hemidiaphragm. Heart size is normal. Aortic atherosclerotic calcifications noted. Hiatal hernia. No pleural effusion or airspace consolidation. IMPRESSION: 1. No acute cardiopulmonary abnormalities. 2. Hiatal hernia. 3.  Aortic Atherosclerosis (ICD10-I70.0). Electronically Signed   By: Signa Kell M.D.   On: 06/22/2020 07:48    Assessment/Plan Active Problems:   Acute on chronic respiratory failure with hypoxia (HCC)   Arterial ischemic stroke, MCA (middle cerebral artery), left, acute (HCC)   Dementia (HCC)   Seizure disorder (HCC)   Acute encephalopathy   1. Acute on chronic respiratory failure with hypoxia patient currently is on pressure support again weaning goal is 16-hour today. 2. Acute stroke no change we will continue with supportive care 3. Dementia unchanged 4. Seizure disorder there has been no active seizures noted. 5. Acute encephalopathy we will continue to monitor secondary to the stroke   I have personally seen and evaluated the patient, evaluated laboratory and imaging results, formulated the assessment and plan and placed orders. The Patient requires high complexity decision making with multiple  systems involvement.  Rounds were done with the Respiratory Therapy Director and Staff therapists and discussed with nursing staff also.  Allyne Gee, MD [facility] Pulmonary Critical Care Medicine Sleep Medicine

## 2020-06-24 DIAGNOSIS — G9340 Encephalopathy, unspecified: Secondary | ICD-10-CM | POA: Diagnosis not present

## 2020-06-24 DIAGNOSIS — F0390 Unspecified dementia without behavioral disturbance: Secondary | ICD-10-CM | POA: Diagnosis not present

## 2020-06-24 DIAGNOSIS — I63512 Cerebral infarction due to unspecified occlusion or stenosis of left middle cerebral artery: Secondary | ICD-10-CM | POA: Diagnosis not present

## 2020-06-24 DIAGNOSIS — J9621 Acute and chronic respiratory failure with hypoxia: Secondary | ICD-10-CM | POA: Diagnosis not present

## 2020-06-24 LAB — CBC
HCT: 24.1 % — ABNORMAL LOW (ref 36.0–46.0)
Hemoglobin: 7.6 g/dL — ABNORMAL LOW (ref 12.0–15.0)
MCH: 30.8 pg (ref 26.0–34.0)
MCHC: 31.5 g/dL (ref 30.0–36.0)
MCV: 97.6 fL (ref 80.0–100.0)
Platelets: 364 10*3/uL (ref 150–400)
RBC: 2.47 MIL/uL — ABNORMAL LOW (ref 3.87–5.11)
RDW: 16.5 % — ABNORMAL HIGH (ref 11.5–15.5)
WBC: 5.6 10*3/uL (ref 4.0–10.5)
nRBC: 0.4 % — ABNORMAL HIGH (ref 0.0–0.2)

## 2020-06-24 LAB — BASIC METABOLIC PANEL
Anion gap: 10 (ref 5–15)
BUN: 24 mg/dL — ABNORMAL HIGH (ref 8–23)
CO2: 26 mmol/L (ref 22–32)
Calcium: 8.9 mg/dL (ref 8.9–10.3)
Chloride: 98 mmol/L (ref 98–111)
Creatinine, Ser: 0.47 mg/dL (ref 0.44–1.00)
GFR calc Af Amer: >60 mL/min (ref >60)
GFR calc non Af Amer: >60 mL/min (ref >60)
Glucose, Bld: 86 mg/dL (ref 70–99)
Potassium: 4.7 mmol/L (ref 3.5–5.1)
Sodium: 134 mmol/L — ABNORMAL LOW (ref 135–145)

## 2020-06-24 NOTE — Progress Notes (Signed)
Pulmonary Critical Care Medicine [facility] [place]   PULMONARY CRITICAL CARE SERVICE  PROGRESS NOTE  Date of Service: 06/24/2020  Chris Narasimhan  DPO:[id number]  DOB: [birth year]   DOA: 06/17/2020  Referring Physician: Carron Curie, MD  HPI: Joy Norman is a 86 y.o. female seen for follow up of Acute on Chronic Respiratory Failure.  Patient currently is on T collar has been on 28% FiO2 did about 4 hours on the T collar  Medications: Reviewed on Rounds  Physical Exam:  Vitals: Temperature is 96.9 pulse 69 respiratory 19 blood pressure is 106/56 saturations are 100%  Ventilator Settings on T collar with an FiO2 of 28%  . General: Comfortable at this time . Eyes: Grossly normal lids, irises & conjunctiva . ENT: grossly tongue is normal . Neck: no obvious mass . Cardiovascular: S1 S2 normal no gallop . Respiratory: No rhonchi very coarse breath sounds . Abdomen: soft . Skin: no rash seen on limited exam . Musculoskeletal: not rigid . Psychiatric:unable to assess . Neurologic: no seizure no involuntary movements         Lab Data:   Basic Metabolic Panel: Recent Labs  Lab 06/18/20 0806 06/22/20 0843 06/24/20 1120  NA 133* 133* 134*  K 4.5 5.1 4.7  CL 96* 96* 98  CO2 28 26 26   GLUCOSE 110* 99 86  BUN 22 28* 24*  CREATININE 0.50 0.56 0.47  CALCIUM 9.0 9.1 8.9    ABG: No results for input(s): PHART, PCO2ART, PO2ART, HCO3, O2SAT in the last 168 hours.  Liver Function Tests: Recent Labs  Lab 06/18/20 0806  AST 32  ALT 21  ALKPHOS 47  BILITOT 0.8  PROT 5.5*  ALBUMIN 2.0*   No results for input(s): LIPASE, AMYLASE in the last 168 hours. No results for input(s): AMMONIA in the last 168 hours.  CBC: Recent Labs  Lab 06/18/20 0806 06/22/20 0843 06/24/20 1120  WBC 6.2 7.8 5.6  HGB 7.0* 7.4* 7.6*  HCT 22.3* 24.1* 24.1*  MCV 94.9 95.3 97.6  PLT 394 392 364    Cardiac Enzymes: No results for input(s): CKTOTAL, CKMB, CKMBINDEX, TROPONINI in the  last 168 hours.  BNP (last 3 results) No results for input(s): BNP in the last 8760 hours.  ProBNP (last 3 results) No results for input(s): PROBNP in the last 8760 hours.  Radiological Exams: No results found.  Assessment/Plan Active Problems:   Acute on chronic respiratory failure with hypoxia (HCC)   Arterial ischemic stroke, MCA (middle cerebral artery), left, acute (HCC)   Dementia (HCC)   Seizure disorder (HCC)   Acute encephalopathy   1. Acute on chronic respiratory failure with hypoxia patient did T collar 28% FiO2 as mentioned 4-hour goal. 2. Arterial stroke no change we will continue with present management 3. Dementia patient is at baseline we will continue to follow 4. Seizure disorder no active seizure noted 5. Acute encephalopathy no change we will continue to follow   I have personally seen and evaluated the patient, evaluated laboratory and imaging results, formulated the assessment and plan and placed orders. The Patient requires high complexity decision making with multiple systems involvement.  Rounds were done with the Respiratory Therapy Director and Staff therapists and discussed with nursing staff also.  06/26/20, MD [facility] Pulmonary Critical Care Medicine Sleep Medicine

## 2020-06-25 DIAGNOSIS — G9340 Encephalopathy, unspecified: Secondary | ICD-10-CM | POA: Diagnosis not present

## 2020-06-25 DIAGNOSIS — F0390 Unspecified dementia without behavioral disturbance: Secondary | ICD-10-CM | POA: Diagnosis not present

## 2020-06-25 DIAGNOSIS — J9621 Acute and chronic respiratory failure with hypoxia: Secondary | ICD-10-CM | POA: Diagnosis not present

## 2020-06-25 DIAGNOSIS — I63512 Cerebral infarction due to unspecified occlusion or stenosis of left middle cerebral artery: Secondary | ICD-10-CM | POA: Diagnosis not present

## 2020-06-25 NOTE — Progress Notes (Signed)
Pulmonary Critical Care Medicine [facility] [place]   PULMONARY CRITICAL CARE SERVICE  PROGRESS NOTE  Date of Service: 06/25/2020  Joy Norman  OFB:[id number]  DOB: September 27, 1928   DOA: 06/17/2020  Referring Physician: Carron Curie, MD  HPI: Joy Norman is a 86 y.o. female seen for follow up of Acute on Chronic Respiratory Failure.  Patient currently is on pressure assist control mode on 28% FiO2 IP was 17.  Yesterday patient was able to do 4 hours of pressure support  Medications: Reviewed on Rounds  Physical Exam:  Vitals: Temperature 97.7 pulse 69 respiratory rate is 18 blood pressure is 115/61 saturations 100%  Ventilator Settings on pressure assist control FiO2 is 28% tidal volume 613 PEEP 5 IP 17  . General: Comfortable at this time . Eyes: Grossly normal lids, irises & conjunctiva . ENT: grossly tongue is normal . Neck: no obvious mass . Cardiovascular: S1 S2 normal no gallop . Respiratory: No rhonchi very coarse breath sounds are noted at this time . Abdomen: soft . Skin: no rash seen on limited exam . Musculoskeletal: not rigid . Psychiatric:unable to assess . Neurologic: no seizure no involuntary movements         Lab Data:   Basic Metabolic Panel: Recent Labs  Lab 06/22/20 0843 06/24/20 1120  NA 133* 134*  K 5.1 4.7  CL 96* 98  CO2 26 26  GLUCOSE 99 86  BUN 28* 24*  CREATININE 0.56 0.47  CALCIUM 9.1 8.9    ABG: No results for input(s): PHART, PCO2ART, PO2ART, HCO3, O2SAT in the last 168 hours.  Liver Function Tests: No results for input(s): AST, ALT, ALKPHOS, BILITOT, PROT, ALBUMIN in the last 168 hours. No results for input(s): LIPASE, AMYLASE in the last 168 hours. No results for input(s): AMMONIA in the last 168 hours.  CBC: Recent Labs  Lab 06/22/20 0843 06/24/20 1120  WBC 7.8 5.6  HGB 7.4* 7.6*  HCT 24.1* 24.1*  MCV 95.3 97.6  PLT 392 364    Cardiac Enzymes: No results for input(s): CKTOTAL, CKMB, CKMBINDEX, TROPONINI  in the last 168 hours.  BNP (last 3 results) No results for input(s): BNP in the last 8760 hours.  ProBNP (last 3 results) No results for input(s): PROBNP in the last 8760 hours.  Radiological Exams: No results found.  Assessment/Plan Active Problems:   Acute on chronic respiratory failure with hypoxia (HCC)   Arterial ischemic stroke, MCA (middle cerebral artery), left, acute (HCC)   Dementia (HCC)   Seizure disorder (HCC)   Acute encephalopathy   1. Acute on chronic respiratory failure hypoxia we will continue to try to wean yesterday was able to do 4 hours of pressure support try advance further today 2. Acute stroke no change no improvement 3. Dementia is at baseline 4. Seizure disorder no active seizures 5. Acute encephalopathy no change we will continue to follow   I have personally seen and evaluated the patient, evaluated laboratory and imaging results, formulated the assessment and plan and placed orders. The Patient requires high complexity decision making with multiple systems involvement.  Rounds were done with the Respiratory Therapy Director and Staff therapists and discussed with nursing staff also.  Yevonne Pax, MD [facility] Pulmonary Critical Care Medicine Sleep Medicine

## 2020-06-26 DIAGNOSIS — J9621 Acute and chronic respiratory failure with hypoxia: Secondary | ICD-10-CM | POA: Diagnosis not present

## 2020-06-26 DIAGNOSIS — I63512 Cerebral infarction due to unspecified occlusion or stenosis of left middle cerebral artery: Secondary | ICD-10-CM | POA: Diagnosis not present

## 2020-06-26 DIAGNOSIS — F0390 Unspecified dementia without behavioral disturbance: Secondary | ICD-10-CM | POA: Diagnosis not present

## 2020-06-26 DIAGNOSIS — G9340 Encephalopathy, unspecified: Secondary | ICD-10-CM | POA: Diagnosis not present

## 2020-06-26 NOTE — Progress Notes (Addendum)
Pulmonary Critical Care Medicine [facility] [place]   PULMONARY CRITICAL CARE SERVICE  PROGRESS NOTE  Date of Service: 06/26/2020  Joy Norman  ZOX:[id number]  DOB: 1928-06-15   DOA: 06/17/2020  Referring Physician: Carron Curie, MD  HPI: Joy Norman is a 86 y.o. female seen for follow up of Acute on Chronic Respiratory Failure.  Patient will remain on aerosol trach collar for 12-hour goal today 28% FiO2 satting well no distress.  Medications: Reviewed on Rounds  Physical Exam:  Vitals: Pulse 68 respirations 30 BP 121/62 O2 sat 100% temp 97.7  Ventilator Settings ATC 28%  . General: Comfortable at this time . Eyes: Grossly normal lids, irises & conjunctiva . ENT: grossly tongue is normal . Neck: no obvious mass . Cardiovascular: S1 S2 normal no gallop . Respiratory: Coarse breath sounds . Abdomen: soft . Skin: no rash seen on limited exam . Musculoskeletal: not rigid . Psychiatric:unable to assess . Neurologic: no seizure no involuntary movements         Lab Data:   Basic Metabolic Panel: Recent Labs  Lab 06/22/20 0843 06/24/20 1120  NA 133* 134*  K 5.1 4.7  CL 96* 98  CO2 26 26  GLUCOSE 99 86  BUN 28* 24*  CREATININE 0.56 0.47  CALCIUM 9.1 8.9    ABG: No results for input(s): PHART, PCO2ART, PO2ART, HCO3, O2SAT in the last 168 hours.  Liver Function Tests: No results for input(s): AST, ALT, ALKPHOS, BILITOT, PROT, ALBUMIN in the last 168 hours. No results for input(s): LIPASE, AMYLASE in the last 168 hours. No results for input(s): AMMONIA in the last 168 hours.  CBC: Recent Labs  Lab 06/22/20 0843 06/24/20 1120  WBC 7.8 5.6  HGB 7.4* 7.6*  HCT 24.1* 24.1*  MCV 95.3 97.6  PLT 392 364    Cardiac Enzymes: No results for input(s): CKTOTAL, CKMB, CKMBINDEX, TROPONINI in the last 168 hours.  BNP (last 3 results) No results for input(s): BNP in the last 8760 hours.  ProBNP (last 3 results) No results for input(s): PROBNP in the  last 8760 hours.  Radiological Exams: No results found.  Assessment/Plan Active Problems:   Acute on chronic respiratory failure with hypoxia (HCC)   Arterial ischemic stroke, MCA (middle cerebral artery), left, acute (HCC)   Dementia (HCC)   Seizure disorder (HCC)   Acute encephalopathy   1. Acute on chronic respiratory failure hypoxia we will continue to try to wean yesterday was able to do 8 hours of pressure support try advance further today 2. Acute stroke no change no improvement 3. Dementia is at baseline 4. Seizure disorder no active seizures 5. Acute encephalopathy no change we will continue to follow   I have personally seen and evaluated the patient, evaluated laboratory and imaging results, formulated the assessment and plan and placed orders. The Patient requires high complexity decision making with multiple systems involvement.  Rounds were done with the Respiratory Therapy Director and Staff therapists and discussed with nursing staff also.  Yevonne Pax, MD [facility] Pulmonary Critical Care Medicine Sleep Medicine

## 2020-06-27 ENCOUNTER — Other Ambulatory Visit (HOSPITAL_COMMUNITY): Payer: Medicare Other

## 2020-06-27 LAB — BASIC METABOLIC PANEL
Anion gap: 11 (ref 5–15)
BUN: 26 mg/dL — ABNORMAL HIGH (ref 8–23)
CO2: 28 mmol/L (ref 22–32)
Calcium: 9.1 mg/dL (ref 8.9–10.3)
Chloride: 95 mmol/L — ABNORMAL LOW (ref 98–111)
Creatinine, Ser: 0.5 mg/dL (ref 0.44–1.00)
GFR calc Af Amer: >60 mL/min (ref >60)
GFR calc non Af Amer: >60 mL/min (ref >60)
Glucose, Bld: 92 mg/dL (ref 70–99)
Potassium: 4.8 mmol/L (ref 3.5–5.1)
Sodium: 134 mmol/L — ABNORMAL LOW (ref 135–145)

## 2020-06-27 LAB — CBC
HCT: 27.3 % — ABNORMAL LOW (ref 36.0–46.0)
Hemoglobin: 8.3 g/dL — ABNORMAL LOW (ref 12.0–15.0)
MCH: 29.6 pg (ref 26.0–34.0)
MCHC: 30.4 g/dL (ref 30.0–36.0)
MCV: 97.5 fL (ref 80.0–100.0)
Platelets: 314 10*3/uL (ref 150–400)
RBC: 2.8 MIL/uL — ABNORMAL LOW (ref 3.87–5.11)
RDW: 17.1 % — ABNORMAL HIGH (ref 11.5–15.5)
WBC: 4.5 10*3/uL (ref 4.0–10.5)
nRBC: 0 % (ref 0.0–0.2)

## 2020-06-28 DIAGNOSIS — I63512 Cerebral infarction due to unspecified occlusion or stenosis of left middle cerebral artery: Secondary | ICD-10-CM | POA: Diagnosis not present

## 2020-06-28 DIAGNOSIS — G9340 Encephalopathy, unspecified: Secondary | ICD-10-CM | POA: Diagnosis not present

## 2020-06-28 DIAGNOSIS — J9621 Acute and chronic respiratory failure with hypoxia: Secondary | ICD-10-CM | POA: Diagnosis not present

## 2020-06-28 DIAGNOSIS — F0390 Unspecified dementia without behavioral disturbance: Secondary | ICD-10-CM | POA: Diagnosis not present

## 2020-06-28 NOTE — Progress Notes (Signed)
Pulmonary Critical Care Medicine [facility] [place]   PULMONARY CRITICAL CARE SERVICE  PROGRESS NOTE  Date of Service: 06/28/2020  Trenese Haft  TKZ:[id number]  DOB: June 01, 1928   DOA: 06/17/2020  Referring Physician: Carron Curie, MD  HPI: Karista Aispuro is a 86 y.o. female seen for follow up of Acute on Chronic Respiratory Failure.  Patient at this time is on T collar has been on 20% FiO2 with a goal of 20 hours  Medications: Reviewed on Rounds  Physical Exam:  Vitals: Temperature was 97.7 pulse 74 respiratory 25 blood pressure is 112/75 saturations 100%  Ventilator Settings on T collar with an FiO2 28%  . General: Comfortable at this time . Eyes: Grossly normal lids, irises & conjunctiva . ENT: grossly tongue is normal . Neck: no obvious mass . Cardiovascular: S1 S2 normal no gallop . Respiratory: Scattered rhonchi expansion is equal . Abdomen: soft . Skin: no rash seen on limited exam . Musculoskeletal: not rigid . Psychiatric:unable to assess . Neurologic: no seizure no involuntary movements         Lab Data:   Basic Metabolic Panel: Recent Labs  Lab 06/22/20 0843 06/24/20 1120 06/27/20 0623  NA 133* 134* 134*  K 5.1 4.7 4.8  CL 96* 98 95*  CO2 26 26 28   GLUCOSE 99 86 92  BUN 28* 24* 26*  CREATININE 0.56 0.47 0.50  CALCIUM 9.1 8.9 9.1    ABG: No results for input(s): PHART, PCO2ART, PO2ART, HCO3, O2SAT in the last 168 hours.  Liver Function Tests: No results for input(s): AST, ALT, ALKPHOS, BILITOT, PROT, ALBUMIN in the last 168 hours. No results for input(s): LIPASE, AMYLASE in the last 168 hours. No results for input(s): AMMONIA in the last 168 hours.  CBC: Recent Labs  Lab 06/22/20 0843 06/24/20 1120 06/27/20 0623  WBC 7.8 5.6 4.5  HGB 7.4* 7.6* 8.3*  HCT 24.1* 24.1* 27.3*  MCV 95.3 97.6 97.5  PLT 392 364 314    Cardiac Enzymes: No results for input(s): CKTOTAL, CKMB, CKMBINDEX, TROPONINI in the last 168 hours.  BNP (last 3  results) No results for input(s): BNP in the last 8760 hours.  ProBNP (last 3 results) No results for input(s): PROBNP in the last 8760 hours.  Radiological Exams: DG CHEST PORT 1 VIEW  Result Date: 06/27/2020 CLINICAL DATA:  Respiratory failure. EXAM: PORTABLE CHEST 1 VIEW COMPARISON:  06/22/2020 FINDINGS: Tracheostomy tube tip is above the carina. Atherosclerotic aortic calcifications noted. Decreased lung volumes with chronic elevation of the right hemidiaphragm. No pleural effusion or airspace consolidation. Hiatal hernia is again noted. Advanced arthropathic changes are noted involving the glenohumeral joints. IMPRESSION: 1. No acute cardiopulmonary abnormalities. 2. Chronic elevation of the right hemidiaphragm. Electronically Signed   By: 06/24/2020 M.D.   On: 06/27/2020 08:31    Assessment/Plan Active Problems:   Acute on chronic respiratory failure with hypoxia (HCC)   Arterial ischemic stroke, MCA (middle cerebral artery), left, acute (HCC)   Dementia (HCC)   Seizure disorder (HCC)   Acute encephalopathy   1. Acute on chronic respiratory failure with hypoxia patient currently is on T collar on 28% FiO2 goal is 20 hours 2. Acute stroke no change patient is at baseline we will monitor 3. Dementia at baseline supportive care 4. Seizure disorder no active seizure noted 5. Acute encephalopathy at baseline we will continue to follow along   I have personally seen and evaluated the patient, evaluated laboratory and imaging results, formulated the assessment and  plan and placed orders. The Patient requires high complexity decision making with multiple systems involvement.  Rounds were done with the Respiratory Therapy Director and Staff therapists and discussed with nursing staff also.  Allyne Gee, MD [facility] Pulmonary Critical Care Medicine Sleep Medicine

## 2020-06-29 DIAGNOSIS — I63512 Cerebral infarction due to unspecified occlusion or stenosis of left middle cerebral artery: Secondary | ICD-10-CM | POA: Diagnosis not present

## 2020-06-29 DIAGNOSIS — J9621 Acute and chronic respiratory failure with hypoxia: Secondary | ICD-10-CM | POA: Diagnosis not present

## 2020-06-29 DIAGNOSIS — G9340 Encephalopathy, unspecified: Secondary | ICD-10-CM | POA: Diagnosis not present

## 2020-06-29 DIAGNOSIS — F0390 Unspecified dementia without behavioral disturbance: Secondary | ICD-10-CM | POA: Diagnosis not present

## 2020-06-29 NOTE — Progress Notes (Signed)
Pulmonary Critical Care Medicine [facility] [place]   PULMONARY CRITICAL CARE SERVICE  PROGRESS NOTE  Date of Service: 06/29/2020  Joy Norman  EZM:[id number]  DOB: Aug 27, 1928   DOA: 06/17/2020  Referring Physician: Carron Curie, MD  HPI: Sentoria Brent is a 86 y.o. female seen for follow up of Acute on Chronic Respiratory Failure.  Patient is on T collar today's goal is 24 hours off the ventilator  Medications: Reviewed on Rounds  Physical Exam:  Vitals: Temperature is 97.2 pulse 70 respiratory 12 blood pressure is 129/68 saturations 100%  Ventilator Settings on T collar 24 hours  . General: Comfortable at this time . Eyes: Grossly normal lids, irises & conjunctiva . ENT: grossly tongue is normal . Neck: no obvious mass . Cardiovascular: S1 S2 normal no gallop . Respiratory: No rhonchi no rales noted at this time . Abdomen: soft . Skin: no rash seen on limited exam . Musculoskeletal: not rigid . Psychiatric:unable to assess . Neurologic: no seizure no involuntary movements         Lab Data:   Basic Metabolic Panel: Recent Labs  Lab 06/24/20 1120 06/27/20 0623  NA 134* 134*  K 4.7 4.8  CL 98 95*  CO2 26 28  GLUCOSE 86 92  BUN 24* 26*  CREATININE 0.47 0.50  CALCIUM 8.9 9.1    ABG: No results for input(s): PHART, PCO2ART, PO2ART, HCO3, O2SAT in the last 168 hours.  Liver Function Tests: No results for input(s): AST, ALT, ALKPHOS, BILITOT, PROT, ALBUMIN in the last 168 hours. No results for input(s): LIPASE, AMYLASE in the last 168 hours. No results for input(s): AMMONIA in the last 168 hours.  CBC: Recent Labs  Lab 06/24/20 1120 06/27/20 0623  WBC 5.6 4.5  HGB 7.6* 8.3*  HCT 24.1* 27.3*  MCV 97.6 97.5  PLT 364 314    Cardiac Enzymes: No results for input(s): CKTOTAL, CKMB, CKMBINDEX, TROPONINI in the last 168 hours.  BNP (last 3 results) No results for input(s): BNP in the last 8760 hours.  ProBNP (last 3 results) No results for  input(s): PROBNP in the last 8760 hours.  Radiological Exams: No results found.  Assessment/Plan Active Problems:   Acute on chronic respiratory failure with hypoxia (HCC)   Arterial ischemic stroke, MCA (middle cerebral artery), left, acute (HCC)   Dementia (HCC)   Seizure disorder (HCC)   Acute encephalopathy   1. Acute on chronic respiratory failure with hypoxia continue with T collar trials titrate as tolerated continue pulmonary toilet. 2. Acute stroke at baseline no improvement 3. Dementia no change no improvement 4. Seizure disorder no active seizures 5. Acute encephalopathy continue to monitor   I have personally seen and evaluated the patient, evaluated laboratory and imaging results, formulated the assessment and plan and placed orders. The Patient requires high complexity decision making with multiple systems involvement.  Rounds were done with the Respiratory Therapy Director and Staff therapists and discussed with nursing staff also.  Yevonne Pax, MD [facility] Pulmonary Critical Care Medicine Sleep Medicine

## 2020-06-30 DIAGNOSIS — I63512 Cerebral infarction due to unspecified occlusion or stenosis of left middle cerebral artery: Secondary | ICD-10-CM | POA: Diagnosis not present

## 2020-06-30 DIAGNOSIS — G9340 Encephalopathy, unspecified: Secondary | ICD-10-CM | POA: Diagnosis not present

## 2020-06-30 DIAGNOSIS — J9621 Acute and chronic respiratory failure with hypoxia: Secondary | ICD-10-CM | POA: Diagnosis not present

## 2020-06-30 DIAGNOSIS — F0390 Unspecified dementia without behavioral disturbance: Secondary | ICD-10-CM | POA: Diagnosis not present

## 2020-06-30 NOTE — Progress Notes (Addendum)
Pulmonary Critical Care Medicine [facility] [place]   PULMONARY CRITICAL CARE SERVICE  PROGRESS NOTE  Date of Service: 06/30/2020  Joy Norman  EXB:[id number]  DOB: Nov 16, 1927   DOA: 06/17/2020  Referring Physician: Carron Curie, MD  HPI: Joy Norman is a 86 y.o. female seen for follow up of Acute on Chronic Respiratory Failure.  Patient remains on 21% aerosol trach collar at this time has been off the vent for 48 hours satting well with no distress.  Medications: Reviewed on Rounds  Physical Exam:  Vitals: Pulse 71 respirations 16 BP 140/75 O2 sat 100% temp 97.2  Ventilator Settings 21% ATC  . General: Comfortable at this time . Eyes: Grossly normal lids, irises & conjunctiva . ENT: grossly tongue is normal . Neck: no obvious mass . Cardiovascular: S1 S2 normal no gallop . Respiratory: No rales or rhonchi noted . Abdomen: soft . Skin: no rash seen on limited exam . Musculoskeletal: not rigid . Psychiatric:unable to assess . Neurologic: no seizure no involuntary movements         Lab Data:   Basic Metabolic Panel: Recent Labs  Lab 06/24/20 1120 06/27/20 0623  NA 134* 134*  K 4.7 4.8  CL 98 95*  CO2 26 28  GLUCOSE 86 92  BUN 24* 26*  CREATININE 0.47 0.50  CALCIUM 8.9 9.1    ABG: No results for input(s): PHART, PCO2ART, PO2ART, HCO3, O2SAT in the last 168 hours.  Liver Function Tests: No results for input(s): AST, ALT, ALKPHOS, BILITOT, PROT, ALBUMIN in the last 168 hours. No results for input(s): LIPASE, AMYLASE in the last 168 hours. No results for input(s): AMMONIA in the last 168 hours.  CBC: Recent Labs  Lab 06/24/20 1120 06/27/20 0623  WBC 5.6 4.5  HGB 7.6* 8.3*  HCT 24.1* 27.3*  MCV 97.6 97.5  PLT 364 314    Cardiac Enzymes: No results for input(s): CKTOTAL, CKMB, CKMBINDEX, TROPONINI in the last 168 hours.  BNP (last 3 results) No results for input(s): BNP in the last 8760 hours.  ProBNP (last 3 results) No results for  input(s): PROBNP in the last 8760 hours.  Radiological Exams: No results found.  Assessment/Plan Active Problems:   Acute on chronic respiratory failure with hypoxia (HCC)   Arterial ischemic stroke, MCA (middle cerebral artery), left, acute (HCC)   Dementia (HCC)   Seizure disorder (HCC)   Acute encephalopathy   1. Acute on chronic respiratory failure with hypoxia continue with T collar trials titrate as tolerated continue pulmonary toilet. 2. Acute stroke at baseline no improvement 3. Dementia no change no improvement 4. Seizure disorder no active seizures 5. Acute encephalopathy continue to monitor   I have personally seen and evaluated the patient, evaluated laboratory and imaging results, formulated the assessment and plan and placed orders. The Patient requires high complexity decision making with multiple systems involvement.  Rounds were done with the Respiratory Therapy Director and Staff therapists and discussed with nursing staff also.  Yevonne Pax, MD [facility] Pulmonary Critical Care Medicine Sleep Medicine

## 2020-07-01 DIAGNOSIS — I63512 Cerebral infarction due to unspecified occlusion or stenosis of left middle cerebral artery: Secondary | ICD-10-CM | POA: Diagnosis not present

## 2020-07-01 DIAGNOSIS — F0390 Unspecified dementia without behavioral disturbance: Secondary | ICD-10-CM | POA: Diagnosis not present

## 2020-07-01 DIAGNOSIS — J9621 Acute and chronic respiratory failure with hypoxia: Secondary | ICD-10-CM | POA: Diagnosis not present

## 2020-07-01 DIAGNOSIS — G9340 Encephalopathy, unspecified: Secondary | ICD-10-CM | POA: Diagnosis not present

## 2020-07-01 NOTE — Progress Notes (Addendum)
Pulmonary Critical Care Medicine [facility] [place]   PULMONARY CRITICAL CARE SERVICE  PROGRESS NOTE  Date of Service: 07/01/2020  Joy Norman  ZHY:[id number]  DOB: [birth year]   DOA: 06/17/2020  Referring Physician: Carron Curie, MD  HPI: Joy Norman is a 86 y.o. female seen for follow up of Acute on Chronic Respiratory Failure.  Patient remains on 21% aerosol trach collar.  Joy Norman has been deflated and will be changed to a #6 cuffless today satting well this time with no distress.  Medications: Reviewed on Rounds  Physical Exam:  Vitals: Pulse 84 respirations 18 BP 127/68 O2 sat 97% temp 98.4  Ventilator Settings 21% ATC  . General: Comfortable at this time . Eyes: Grossly normal lids, irises & conjunctiva . ENT: grossly tongue is normal . Neck: no obvious mass . Cardiovascular: S1 S2 normal no gallop . Respiratory: No rales or rhonchi noted . Abdomen: soft . Skin: no rash seen on limited exam . Musculoskeletal: not rigid . Psychiatric:unable to assess . Neurologic: no seizure no involuntary movements         Lab Data:   Basic Metabolic Panel: Recent Labs  Lab 06/27/20 0623  NA 134*  K 4.8  CL 95*  CO2 28  GLUCOSE 92  BUN 26*  CREATININE 0.50  CALCIUM 9.1    ABG: No results for input(s): PHART, PCO2ART, PO2ART, HCO3, O2SAT in the last 168 hours.  Liver Function Tests: No results for input(s): AST, ALT, ALKPHOS, BILITOT, PROT, ALBUMIN in the last 168 hours. No results for input(s): LIPASE, AMYLASE in the last 168 hours. No results for input(s): AMMONIA in the last 168 hours.  CBC: Recent Labs  Lab 06/27/20 0623  WBC 4.5  HGB 8.3*  HCT 27.3*  MCV 97.5  PLT 314    Cardiac Enzymes: No results for input(s): CKTOTAL, CKMB, CKMBINDEX, TROPONINI in the last 168 hours.  BNP (last 3 results) No results for input(s): BNP in the last 8760 hours.  ProBNP (last 3 results) No results for input(s): PROBNP in the last 8760 hours.  Radiological  Exams: No results found.  Assessment/Plan Active Problems:   Acute on chronic respiratory failure with hypoxia (HCC)   Arterial ischemic stroke, MCA (middle cerebral artery), left, acute (HCC)   Dementia (HCC)   Seizure disorder (HCC)   Acute encephalopathy   1. Acute on chronic respiratory failure with hypoxia continue with T collar trials titrate as tolerated continue pulmonary toilet. 2. Acute stroke at baseline no improvement 3. Dementia no change no improvement 4. Seizure disorder no active seizures 5. Acute encephalopathy continue to monitor   I have personally seen and evaluated the patient, evaluated laboratory and imaging results, formulated the assessment and plan and placed orders. The Patient requires high complexity decision making with multiple systems involvement.  Rounds were done with the Respiratory Therapy Director and Staff therapists and discussed with nursing staff also.  Yevonne Pax, MD [facility] Pulmonary Critical Care Medicine Sleep Medicine

## 2020-07-02 DIAGNOSIS — F0390 Unspecified dementia without behavioral disturbance: Secondary | ICD-10-CM | POA: Diagnosis not present

## 2020-07-02 DIAGNOSIS — I63512 Cerebral infarction due to unspecified occlusion or stenosis of left middle cerebral artery: Secondary | ICD-10-CM | POA: Diagnosis not present

## 2020-07-02 DIAGNOSIS — G9340 Encephalopathy, unspecified: Secondary | ICD-10-CM | POA: Diagnosis not present

## 2020-07-02 DIAGNOSIS — J9621 Acute and chronic respiratory failure with hypoxia: Secondary | ICD-10-CM | POA: Diagnosis not present

## 2020-07-02 NOTE — Progress Notes (Addendum)
Pulmonary Critical Care Medicine [facility] [place]   PULMONARY CRITICAL CARE SERVICE  PROGRESS NOTE  Date of Service: 07/02/2020  Joy Norman  IRW:[id number]  DOB: [birth year]   DOA: 06/17/2020  Referring Physician: Carron Curie, MD  HPI: Joy Norman is a 86 y.o. female seen for follow up of Acute on Chronic Respiratory Failure.  Patient continues on 21% aerosol trach collar using PMV with no difficulty.  Medications: Reviewed on Rounds  Physical Exam:  Vitals: Pulse 73 respirations 17 BP 147/86 O2 sat 100% temp 96.1  Ventilator Settings 21% ATC  . General: Comfortable at this time . Eyes: Grossly normal lids, irises & conjunctiva . ENT: grossly tongue is normal . Neck: no obvious mass . Cardiovascular: S1 S2 normal no gallop . Respiratory: Coarse breath sounds . Abdomen: soft . Skin: no rash seen on limited exam . Musculoskeletal: not rigid . Psychiatric:unable to assess . Neurologic: no seizure no involuntary movements         Lab Data:   Basic Metabolic Panel: Recent Labs  Lab 06/27/20 0623  NA 134*  K 4.8  CL 95*  CO2 28  GLUCOSE 92  BUN 26*  CREATININE 0.50  CALCIUM 9.1    ABG: No results for input(s): PHART, PCO2ART, PO2ART, HCO3, O2SAT in the last 168 hours.  Liver Function Tests: No results for input(s): AST, ALT, ALKPHOS, BILITOT, PROT, ALBUMIN in the last 168 hours. No results for input(s): LIPASE, AMYLASE in the last 168 hours. No results for input(s): AMMONIA in the last 168 hours.  CBC: Recent Labs  Lab 06/27/20 0623  WBC 4.5  HGB 8.3*  HCT 27.3*  MCV 97.5  PLT 314    Cardiac Enzymes: No results for input(s): CKTOTAL, CKMB, CKMBINDEX, TROPONINI in the last 168 hours.  BNP (last 3 results) No results for input(s): BNP in the last 8760 hours.  ProBNP (last 3 results) No results for input(s): PROBNP in the last 8760 hours.  Radiological Exams: No results found.  Assessment/Plan Active Problems:   Acute on chronic  respiratory failure with hypoxia (HCC)   Arterial ischemic stroke, MCA (middle cerebral artery), left, acute (HCC)   Dementia (HCC)   Seizure disorder (HCC)   Acute encephalopathy   1. Acute on chronic respiratory failure with hypoxia continue with T collar trials titrate as tolerated continue pulmonary toilet. 2. Acute stroke at baseline no improvement 3. Dementia no change no improvement 4. Seizure disorder no active seizures 5. Acute encephalopathy continue to monitor   I have personally seen and evaluated the patient, evaluated laboratory and imaging results, formulated the assessment and plan and placed orders. The Patient requires high complexity decision making with multiple systems involvement.  Rounds were done with the Respiratory Therapy Director and Staff therapists and discussed with nursing staff also.  Yevonne Pax, MD [facility] Pulmonary Critical Care Medicine Sleep Medicine

## 2020-07-03 DIAGNOSIS — F0390 Unspecified dementia without behavioral disturbance: Secondary | ICD-10-CM | POA: Diagnosis not present

## 2020-07-03 DIAGNOSIS — J9621 Acute and chronic respiratory failure with hypoxia: Secondary | ICD-10-CM | POA: Diagnosis not present

## 2020-07-03 DIAGNOSIS — G9340 Encephalopathy, unspecified: Secondary | ICD-10-CM | POA: Diagnosis not present

## 2020-07-03 DIAGNOSIS — I63512 Cerebral infarction due to unspecified occlusion or stenosis of left middle cerebral artery: Secondary | ICD-10-CM | POA: Diagnosis not present

## 2020-07-03 LAB — CBC
HCT: 28 % — ABNORMAL LOW (ref 36.0–46.0)
Hemoglobin: 8.4 g/dL — ABNORMAL LOW (ref 12.0–15.0)
MCH: 29.4 pg (ref 26.0–34.0)
MCHC: 30 g/dL (ref 30.0–36.0)
MCV: 97.9 fL (ref 80.0–100.0)
Platelets: 252 10*3/uL (ref 150–400)
RBC: 2.86 MIL/uL — ABNORMAL LOW (ref 3.87–5.11)
RDW: 17.7 % — ABNORMAL HIGH (ref 11.5–15.5)
WBC: 5.1 10*3/uL (ref 4.0–10.5)
nRBC: 0 % (ref 0.0–0.2)

## 2020-07-03 LAB — BASIC METABOLIC PANEL
Anion gap: 9 (ref 5–15)
BUN: 28 mg/dL — ABNORMAL HIGH (ref 8–23)
CO2: 28 mmol/L (ref 22–32)
Calcium: 9 mg/dL (ref 8.9–10.3)
Chloride: 97 mmol/L — ABNORMAL LOW (ref 98–111)
Creatinine, Ser: 0.53 mg/dL (ref 0.44–1.00)
GFR calc Af Amer: >60 mL/min (ref >60)
GFR calc non Af Amer: >60 mL/min (ref >60)
Glucose, Bld: 111 mg/dL — ABNORMAL HIGH (ref 70–99)
Potassium: 4.4 mmol/L (ref 3.5–5.1)
Sodium: 134 mmol/L — ABNORMAL LOW (ref 135–145)

## 2020-07-03 LAB — MAGNESIUM: Magnesium: 1.6 mg/dL — ABNORMAL LOW (ref 1.7–2.4)

## 2020-07-03 NOTE — Progress Notes (Addendum)
Pulmonary Critical Care Medicine [facility] [place]   PULMONARY CRITICAL CARE SERVICE  PROGRESS NOTE  Date of Service: 07/03/2020  Joy Norman  HMC:[id number]  DOB: July 01, 1928   DOA: 06/17/2020  Referring Physician: Carron Curie, MD  HPI: Joy Norman is a 86 y.o. female seen for follow up of Acute on Chronic Respiratory Failure.  Patient mains on 21% aerosol trach collar.  No difficulty satting well no fever or distress  Medications: Reviewed on Rounds  Physical Exam:  Vitals: Pulse 71 respirations 18 BP 125/47 O2 sat 99% temp 97.3  Ventilator Settings 21% aerosol trach collar  . General: Comfortable at this time . Eyes: Grossly normal lids, irises & conjunctiva . ENT: grossly tongue is normal . Neck: no obvious mass . Cardiovascular: S1 S2 normal no gallop . Respiratory: Coarse breath sounds . Abdomen: soft . Skin: no rash seen on limited exam . Musculoskeletal: not rigid . Psychiatric:unable to assess . Neurologic: no seizure no involuntary movements         Lab Data:   Basic Metabolic Panel: Recent Labs  Lab 06/27/20 0623 07/03/20 1705  NA 134* 134*  K 4.8 4.4  CL 95* 97*  CO2 28 28  GLUCOSE 92 111*  BUN 26* 28*  CREATININE 0.50 0.53  CALCIUM 9.1 9.0  MG  --  1.6*    ABG: No results for input(s): PHART, PCO2ART, PO2ART, HCO3, O2SAT in the last 168 hours.  Liver Function Tests: No results for input(s): AST, ALT, ALKPHOS, BILITOT, PROT, ALBUMIN in the last 168 hours. No results for input(s): LIPASE, AMYLASE in the last 168 hours. No results for input(s): AMMONIA in the last 168 hours.  CBC: Recent Labs  Lab 06/27/20 0623 07/03/20 1705  WBC 4.5 5.1  HGB 8.3* 8.4*  HCT 27.3* 28.0*  MCV 97.5 97.9  PLT 314 252    Cardiac Enzymes: No results for input(s): CKTOTAL, CKMB, CKMBINDEX, TROPONINI in the last 168 hours.  BNP (last 3 results) No results for input(s): BNP in the last 8760 hours.  ProBNP (last 3 results) No results for  input(s): PROBNP in the last 8760 hours.  Radiological Exams: No results found.  Assessment/Plan Active Problems:   Acute on chronic respiratory failure with hypoxia (HCC)   Arterial ischemic stroke, MCA (middle cerebral artery), left, acute (HCC)   Dementia (HCC)   Seizure disorder (HCC)   Acute encephalopathy   1. Acute on chronic respiratory failure with hypoxia continue with T collar trials titrate as tolerated continue pulmonary toilet. 2. Acute stroke at baseline no improvement 3. Dementia no change no improvement 4. Seizure disorder no active seizures 5. Acute encephalopathy continue to monitor   I have personally seen and evaluated the patient, evaluated laboratory and imaging results, formulated the assessment and plan and placed orders. The Patient requires high complexity decision making with multiple systems involvement.  Rounds were done with the Respiratory Therapy Director and Staff therapists and discussed with nursing staff also.  Yevonne Pax, MD [facility] Pulmonary Critical Care Medicine Sleep Medicine

## 2020-07-04 DIAGNOSIS — F0390 Unspecified dementia without behavioral disturbance: Secondary | ICD-10-CM | POA: Diagnosis not present

## 2020-07-04 DIAGNOSIS — J9621 Acute and chronic respiratory failure with hypoxia: Secondary | ICD-10-CM | POA: Diagnosis not present

## 2020-07-04 DIAGNOSIS — I63512 Cerebral infarction due to unspecified occlusion or stenosis of left middle cerebral artery: Secondary | ICD-10-CM | POA: Diagnosis not present

## 2020-07-04 DIAGNOSIS — G9340 Encephalopathy, unspecified: Secondary | ICD-10-CM | POA: Diagnosis not present

## 2020-07-04 LAB — MAGNESIUM: Magnesium: 1.9 mg/dL (ref 1.7–2.4)

## 2020-07-04 NOTE — Progress Notes (Signed)
Pulmonary Critical Care Medicine [facility] [place]   PULMONARY CRITICAL CARE SERVICE  PROGRESS NOTE  Date of Service: 07/04/2020  Memphis Creswell  WVP:[id number]  DOB: [birth year]   DOA: 06/17/2020  Referring Physician: Carron Curie, MD  HPI: Maeley Matton is a 86 y.o. female seen for follow up of Acute on Chronic Respiratory Failure.  Patient right now is on T collar on room air good saturations are noted  Medications: Reviewed on Rounds  Physical Exam:  Vitals: Temperature is 97.8 pulse 75 respiratory rate 16 blood pressure is 118/50 saturations 98%  Ventilator Settings on T collar room air  . General: Comfortable at this time . Eyes: Grossly normal lids, irises & conjunctiva . ENT: grossly tongue is normal . Neck: no obvious mass . Cardiovascular: S1 S2 normal no gallop . Respiratory: No rhonchi no rales noted at this time . Abdomen: soft . Skin: no rash seen on limited exam . Musculoskeletal: not rigid . Psychiatric:unable to assess . Neurologic: no seizure no involuntary movements         Lab Data:   Basic Metabolic Panel: Recent Labs  Lab 07/03/20 1705 07/04/20 1610  NA 134*  --   K 4.4  --   CL 97*  --   CO2 28  --   GLUCOSE 111*  --   BUN 28*  --   CREATININE 0.53  --   CALCIUM 9.0  --   MG 1.6* 1.9    ABG: No results for input(s): PHART, PCO2ART, PO2ART, HCO3, O2SAT in the last 168 hours.  Liver Function Tests: No results for input(s): AST, ALT, ALKPHOS, BILITOT, PROT, ALBUMIN in the last 168 hours. No results for input(s): LIPASE, AMYLASE in the last 168 hours. No results for input(s): AMMONIA in the last 168 hours.  CBC: Recent Labs  Lab 07/03/20 1705  WBC 5.1  HGB 8.4*  HCT 28.0*  MCV 97.9  PLT 252    Cardiac Enzymes: No results for input(s): CKTOTAL, CKMB, CKMBINDEX, TROPONINI in the last 168 hours.  BNP (last 3 results) No results for input(s): BNP in the last 8760 hours.  ProBNP (last 3 results) No results for  input(s): PROBNP in the last 8760 hours.  Radiological Exams: No results found.  Assessment/Plan Active Problems:   Acute on chronic respiratory failure with hypoxia (HCC)   Arterial ischemic stroke, MCA (middle cerebral artery), left, acute (HCC)   Dementia (HCC)   Seizure disorder (HCC)   Acute encephalopathy   1. Acute on chronic respiratory failure hypoxia continue T collar trials titrate oxygen continue pulmonary toilet 2. Acute stroke no change we will continue with supportive care 3. Dementia patient is at baseline 4. Seizure disorder no active seizures noted 5. Encephalopathy at baseline   I have personally seen and evaluated the patient, evaluated laboratory and imaging results, formulated the assessment and plan and placed orders. The Patient requires high complexity decision making with multiple systems involvement.  Rounds were done with the Respiratory Therapy Director and Staff therapists and discussed with nursing staff also.  Yevonne Pax, MD [facility] Pulmonary Critical Care Medicine Sleep Medicine

## 2020-07-05 DIAGNOSIS — F0390 Unspecified dementia without behavioral disturbance: Secondary | ICD-10-CM | POA: Diagnosis not present

## 2020-07-05 DIAGNOSIS — J9621 Acute and chronic respiratory failure with hypoxia: Secondary | ICD-10-CM | POA: Diagnosis not present

## 2020-07-05 DIAGNOSIS — I63512 Cerebral infarction due to unspecified occlusion or stenosis of left middle cerebral artery: Secondary | ICD-10-CM | POA: Diagnosis not present

## 2020-07-05 DIAGNOSIS — G9340 Encephalopathy, unspecified: Secondary | ICD-10-CM | POA: Diagnosis not present

## 2020-07-05 NOTE — Progress Notes (Signed)
Pulmonary Critical Care Medicine [facility] [place]   PULMONARY CRITICAL CARE SERVICE  PROGRESS NOTE  Date of Service: 07/05/2020  Joy Norman  [id number]  DOB: 11-17-1927   DOA: 06/17/2020  Referring Physician: Carron Curie, MD  HPI: Joy Norman is a 86 y.o. female seen for follow up of Acute on Chronic Respiratory Failure.  Patient has been off the ventilator we will start down to room air on T collar  Medications: Reviewed on Rounds  Physical Exam:  Vitals: Temperature is 96.8 pulse 69 respiratory 14 blood pressure is 120/56 saturations 98%  Ventilator Settings off the ventilator on T collar  . General: Comfortable at this time . Eyes: Grossly normal lids, irises & conjunctiva . ENT: grossly tongue is normal . Neck: no obvious mass . Cardiovascular: S1 S2 normal no gallop . Respiratory: No rhonchi or rales are noted at this time . Abdomen: soft . Skin: no rash seen on limited exam . Musculoskeletal: not rigid . Psychiatric:unable to assess . Neurologic: no seizure no involuntary movements         Lab Data:   Basic Metabolic Panel: Recent Labs  Lab 07/03/20 1705 07/04/20 1610  NA 134*  --   K 4.4  --   CL 97*  --   CO2 28  --   GLUCOSE 111*  --   BUN 28*  --   CREATININE 0.53  --   CALCIUM 9.0  --   MG 1.6* 1.9    ABG: No results for input(s): PHART, PCO2ART, PO2ART, HCO3, O2SAT in the last 168 hours.  Liver Function Tests: No results for input(s): AST, ALT, ALKPHOS, BILITOT, PROT, ALBUMIN in the last 168 hours. No results for input(s): LIPASE, AMYLASE in the last 168 hours. No results for input(s): AMMONIA in the last 168 hours.  CBC: Recent Labs  Lab 07/03/20 1705  WBC 5.1  HGB 8.4*  HCT 28.0*  MCV 97.9  PLT 252    Cardiac Enzymes: No results for input(s): CKTOTAL, CKMB, CKMBINDEX, TROPONINI in the last 168 hours.  BNP (last 3 results) No results for input(s): BNP in the last 8760 hours.  ProBNP (last 3 results) No  results for input(s): PROBNP in the last 8760 hours.  Radiological Exams: No results found.  Assessment/Plan Active Problems:   Acute on chronic respiratory failure with hypoxia (HCC)   Arterial ischemic stroke, MCA (middle cerebral artery), left, acute (HCC)   Dementia (HCC)   Seizure disorder (HCC)   Acute encephalopathy   1. Acute on chronic respiratory failure hypoxia plan is to continue on T collar trials wean as tolerated.  Right now is not requiring oxygen. 2. Acute stroke no change continue with supportive care 3. Dementia at baseline 4. Seizure disorder no active seizure noted at this time 5. Acute encephalopathy at baseline   I have personally seen and evaluated the patient, evaluated laboratory and imaging results, formulated the assessment and plan and placed orders. The Patient requires high complexity decision making with multiple systems involvement.  Rounds were done with the Respiratory Therapy Director and Staff therapists and discussed with nursing staff also.  Yevonne Pax, MD [facility] Pulmonary Critical Care Medicine Sleep Medicine

## 2020-07-06 DIAGNOSIS — I63512 Cerebral infarction due to unspecified occlusion or stenosis of left middle cerebral artery: Secondary | ICD-10-CM | POA: Diagnosis not present

## 2020-07-06 DIAGNOSIS — J9621 Acute and chronic respiratory failure with hypoxia: Secondary | ICD-10-CM | POA: Diagnosis not present

## 2020-07-06 DIAGNOSIS — F0390 Unspecified dementia without behavioral disturbance: Secondary | ICD-10-CM | POA: Diagnosis not present

## 2020-07-06 DIAGNOSIS — G9340 Encephalopathy, unspecified: Secondary | ICD-10-CM | POA: Diagnosis not present

## 2020-07-06 NOTE — Progress Notes (Signed)
Pulmonary Critical Care Medicine [facility] [place]   PULMONARY CRITICAL CARE SERVICE  PROGRESS NOTE  Date of Service: 07/06/2020  Joy Norman  WNI:[id number]  DOB: [birth year]   DOA: 06/17/2020  Referring Physician: Carron Curie, MD  HPI: Joy Norman is a 86 y.o. female seen for follow up of Acute on Chronic Respiratory Failure.  Patient at this time is on T collar is on room air should be able to start with capping trials  Medications: Reviewed on Rounds  Physical Exam:  Vitals: Temperature is 96.3 pulse 89 respiratory rate 35 blood pressure is 145/69 saturations 97%  Ventilator Settings on T collar room air  . General: Comfortable at this time . Eyes: Grossly normal lids, irises & conjunctiva . ENT: grossly tongue is normal . Neck: no obvious mass . Cardiovascular: S1 S2 normal no gallop . Respiratory: No rhonchi no rales are noted at this time . Abdomen: soft . Skin: no rash seen on limited exam . Musculoskeletal: not rigid . Psychiatric:unable to assess . Neurologic: no seizure no involuntary movements         Lab Data:   Basic Metabolic Panel: Recent Labs  Lab 07/03/20 1705 07/04/20 1610  NA 134*  --   K 4.4  --   CL 97*  --   CO2 28  --   GLUCOSE 111*  --   BUN 28*  --   CREATININE 0.53  --   CALCIUM 9.0  --   MG 1.6* 1.9    ABG: No results for input(s): PHART, PCO2ART, PO2ART, HCO3, O2SAT in the last 168 hours.  Liver Function Tests: No results for input(s): AST, ALT, ALKPHOS, BILITOT, PROT, ALBUMIN in the last 168 hours. No results for input(s): LIPASE, AMYLASE in the last 168 hours. No results for input(s): AMMONIA in the last 168 hours.  CBC: Recent Labs  Lab 07/03/20 1705  WBC 5.1  HGB 8.4*  HCT 28.0*  MCV 97.9  PLT 252    Cardiac Enzymes: No results for input(s): CKTOTAL, CKMB, CKMBINDEX, TROPONINI in the last 168 hours.  BNP (last 3 results) No results for input(s): BNP in the last 8760 hours.  ProBNP (last 3  results) No results for input(s): PROBNP in the last 8760 hours.  Radiological Exams: No results found.  Assessment/Plan Active Problems:   Acute on chronic respiratory failure with hypoxia (HCC)   Arterial ischemic stroke, MCA (middle cerebral artery), left, acute (HCC)   Dementia (HCC)   Seizure disorder (HCC)   Acute encephalopathy   1. Acute on chronic respiratory failure hypoxia we will continue T collar trials I think patient is ready at this stage to try capping discussed on rounds with respiratory therapy 2. Acute stroke no change continue with supportive care 3. Dementia patient is at baseline 4. Seizure disorder no active seizures 5. Encephalopathy patient is at baseline   I have personally seen and evaluated the patient, evaluated laboratory and imaging results, formulated the assessment and plan and placed orders. The Patient requires high complexity decision making with multiple systems involvement.  Rounds were done with the Respiratory Therapy Director and Staff therapists and discussed with nursing staff also.  Yevonne Pax, MD [facility] Pulmonary Critical Care Medicine Sleep Medicine

## 2020-07-07 DIAGNOSIS — I63512 Cerebral infarction due to unspecified occlusion or stenosis of left middle cerebral artery: Secondary | ICD-10-CM | POA: Diagnosis not present

## 2020-07-07 DIAGNOSIS — F0390 Unspecified dementia without behavioral disturbance: Secondary | ICD-10-CM | POA: Diagnosis not present

## 2020-07-07 DIAGNOSIS — G9340 Encephalopathy, unspecified: Secondary | ICD-10-CM | POA: Diagnosis not present

## 2020-07-07 DIAGNOSIS — J9621 Acute and chronic respiratory failure with hypoxia: Secondary | ICD-10-CM | POA: Diagnosis not present

## 2020-07-07 NOTE — Progress Notes (Addendum)
Pulmonary Critical Care Medicine [facility] [place]   PULMONARY CRITICAL CARE SERVICE  PROGRESS NOTE  Date of Service: 07/07/2020  Joy Norman  ZDG:[id number]  DOB: [birth year]   DOA: 06/17/2020  Referring Physician: Carron Curie, MD  HPI: Joy Norman is a 86 y.o. female seen for follow up of Acute on Chronic Respiratory Failure. Patient remains capped on room air with no distress or fever at this time.   Medications: Reviewed on Rounds  Physical Exam:  Vitals: pulse 79, resp 32, bp 143/62, o2 sat 99%, temp 97.6  Ventilator Settings no currently on ventilator  . General: Comfortable at this time . Eyes: Grossly normal lids, irises & conjunctiva . ENT: grossly tongue is normal . Neck: no obvious mass . Cardiovascular: S1 S2 normal no gallop . Respiratory: no rales or ronchi noted . Abdomen: soft . Skin: no rash seen on limited exam . Musculoskeletal: not rigid . Psychiatric:unable to assess . Neurologic: no seizure no involuntary movements         Lab Data:   Basic Metabolic Panel: Recent Labs  Lab 07/03/20 1705 07/04/20 1610  NA 134*  --   K 4.4  --   CL 97*  --   CO2 28  --   GLUCOSE 111*  --   BUN 28*  --   CREATININE 0.53  --   CALCIUM 9.0  --   MG 1.6* 1.9    ABG: No results for input(s): PHART, PCO2ART, PO2ART, HCO3, O2SAT in the last 168 hours.  Liver Function Tests: No results for input(s): AST, ALT, ALKPHOS, BILITOT, PROT, ALBUMIN in the last 168 hours. No results for input(s): LIPASE, AMYLASE in the last 168 hours. No results for input(s): AMMONIA in the last 168 hours.  CBC: Recent Labs  Lab 07/03/20 1705  WBC 5.1  HGB 8.4*  HCT 28.0*  MCV 97.9  PLT 252    Cardiac Enzymes: No results for input(s): CKTOTAL, CKMB, CKMBINDEX, TROPONINI in the last 168 hours.  BNP (last 3 results) No results for input(s): BNP in the last 8760 hours.  ProBNP (last 3 results) No results for input(s): PROBNP in the last 8760  hours.  Radiological Exams: No results found.  Assessment/Plan Active Problems:   Acute on chronic respiratory failure with hypoxia (HCC)   Arterial ischemic stroke, MCA (middle cerebral artery), left, acute (HCC)   Dementia (HCC)   Seizure disorder (HCC)   Acute encephalopathy   1. Acute on chronic respiratory failure hypoxia continue with capping at this time.  Goal or 24 hours.  Continue supportive measures.  2. Acute stroke no change continue with supportive care 3. Dementia patient is at baseline 4. Seizure disorder no active seizures 5. Encephalopathy patient is at baseline   I have personally seen and evaluated the patient, evaluated laboratory and imaging results, formulated the assessment and plan and placed orders. The Patient requires high complexity decision making with multiple systems involvement.  Rounds were done with the Respiratory Therapy Director and Staff therapists and discussed with nursing staff also.  Yevonne Pax, MD [facility] Pulmonary Critical Care Medicine Sleep Medicine

## 2020-07-08 DIAGNOSIS — F0390 Unspecified dementia without behavioral disturbance: Secondary | ICD-10-CM | POA: Diagnosis not present

## 2020-07-08 DIAGNOSIS — J9621 Acute and chronic respiratory failure with hypoxia: Secondary | ICD-10-CM | POA: Diagnosis not present

## 2020-07-08 DIAGNOSIS — G9340 Encephalopathy, unspecified: Secondary | ICD-10-CM | POA: Diagnosis not present

## 2020-07-08 DIAGNOSIS — I63512 Cerebral infarction due to unspecified occlusion or stenosis of left middle cerebral artery: Secondary | ICD-10-CM | POA: Diagnosis not present

## 2020-07-08 LAB — CBC
HCT: 31.3 % — ABNORMAL LOW (ref 36.0–46.0)
Hemoglobin: 9.7 g/dL — ABNORMAL LOW (ref 12.0–15.0)
MCH: 30.2 pg (ref 26.0–34.0)
MCHC: 31 g/dL (ref 30.0–36.0)
MCV: 97.5 fL (ref 80.0–100.0)
Platelets: 233 10*3/uL (ref 150–400)
RBC: 3.21 MIL/uL — ABNORMAL LOW (ref 3.87–5.11)
RDW: 17.4 % — ABNORMAL HIGH (ref 11.5–15.5)
WBC: 4.4 10*3/uL (ref 4.0–10.5)
nRBC: 0 % (ref 0.0–0.2)

## 2020-07-08 LAB — BASIC METABOLIC PANEL
Anion gap: 8 (ref 5–15)
BUN: 28 mg/dL — ABNORMAL HIGH (ref 8–23)
CO2: 29 mmol/L (ref 22–32)
Calcium: 9.3 mg/dL (ref 8.9–10.3)
Chloride: 96 mmol/L — ABNORMAL LOW (ref 98–111)
Creatinine, Ser: 0.45 mg/dL (ref 0.44–1.00)
GFR calc Af Amer: >60 mL/min (ref >60)
GFR calc non Af Amer: >60 mL/min (ref >60)
Glucose, Bld: 93 mg/dL (ref 70–99)
Potassium: 3.9 mmol/L (ref 3.5–5.1)
Sodium: 133 mmol/L — ABNORMAL LOW (ref 135–145)

## 2020-07-08 LAB — MAGNESIUM: Magnesium: 1.6 mg/dL — ABNORMAL LOW (ref 1.7–2.4)

## 2020-07-08 NOTE — Progress Notes (Addendum)
Pulmonary Critical Care Medicine [facility] [place]   PULMONARY CRITICAL CARE SERVICE  PROGRESS NOTE  Date of Service: 07/08/2020  Joy Norman  GDJ:[id number]  DOB: 1927-12-15   DOA: 06/17/2020  Referring Physician: Carron Curie, MD  HPI: Joy Norman is a 86 y.o. female seen for follow up of Acute on Chronic Respiratory Failure. Patient remains capped on room air at this time for 48 hours.  Sating well with no fever or distress.   Medications: Reviewed on Rounds  Physical Exam:  Vitals: pulse 71, resp 24, bp 138/75, o2 sat 100%, temp 96.3  Ventilator Settings not currently on vent.   . General: Comfortable at this time . Eyes: Grossly normal lids, irises & conjunctiva . ENT: grossly tongue is normal . Neck: no obvious mass . Cardiovascular: S1 S2 normal no gallop . Respiratory: no rales or ronchi noted . Abdomen: soft . Skin: no rash seen on limited exam . Musculoskeletal: not rigid . Psychiatric:unable to assess . Neurologic: no seizure no involuntary movements         Lab Data:   Basic Metabolic Panel: Recent Labs  Lab 07/03/20 1705 07/04/20 1610 07/08/20 0853  NA 134*  --  133*  K 4.4  --  3.9  CL 97*  --  96*  CO2 28  --  29  GLUCOSE 111*  --  93  BUN 28*  --  28*  CREATININE 0.53  --  0.45  CALCIUM 9.0  --  9.3  MG 1.6* 1.9 1.6*    ABG: No results for input(s): PHART, PCO2ART, PO2ART, HCO3, O2SAT in the last 168 hours.  Liver Function Tests: No results for input(s): AST, ALT, ALKPHOS, BILITOT, PROT, ALBUMIN in the last 168 hours. No results for input(s): LIPASE, AMYLASE in the last 168 hours. No results for input(s): AMMONIA in the last 168 hours.  CBC: Recent Labs  Lab 07/03/20 1705 07/08/20 0853  WBC 5.1 4.4  HGB 8.4* 9.7*  HCT 28.0* 31.3*  MCV 97.9 97.5  PLT 252 233    Cardiac Enzymes: No results for input(s): CKTOTAL, CKMB, CKMBINDEX, TROPONINI in the last 168 hours.  BNP (last 3 results) No results for input(s): BNP in  the last 8760 hours.  ProBNP (last 3 results) No results for input(s): PROBNP in the last 8760 hours.  Radiological Exams: No results found.  Assessment/Plan Active Problems:   Acute on chronic respiratory failure with hypoxia (HCC)   Arterial ischemic stroke, MCA (middle cerebral artery), left, acute (HCC)   Dementia (HCC)   Seizure disorder (HCC)   Acute encephalopathy   1. Acute on chronic respiratory failure hypoxia continue with capping at this time.  Goal or 48 hours.  Continue supportive measures and pulmonary toilet.   2. Acute stroke no change continue with supportive care 3. Dementia patient is at baseline 4. Seizure disorder no active seizures 5. Encephalopathy patient is at baseline   I have personally seen and evaluated the patient, evaluated laboratory and imaging results, formulated the assessment and plan and placed orders. The Patient requires high complexity decision making with multiple systems involvement.  Rounds were done with the Respiratory Therapy Director and Staff therapists and discussed with nursing staff also.  Yevonne Pax, MD [facility] Pulmonary Critical Care Medicine Sleep Medicine

## 2020-07-09 LAB — SARS CORONAVIRUS 2 (TAT 6-24 HRS): SARS Coronavirus 2: NEGATIVE

## 2021-12-31 ENCOUNTER — Ambulatory Visit (HOSPITAL_COMMUNITY)
Admission: RE | Admit: 2021-12-31 | Discharge: 2021-12-31 | Disposition: A | Discharge status: Home / Self Care | Payer: Medicare Other | Source: Ambulatory Visit | Attending: Radiology | Admitting: Radiology

## 2021-12-31 ENCOUNTER — Emergency Department (HOSPITAL_COMMUNITY): Payer: Medicare Other

## 2021-12-31 ENCOUNTER — Other Ambulatory Visit (HOSPITAL_COMMUNITY): Payer: Self-pay | Admitting: Radiology

## 2021-12-31 ENCOUNTER — Inpatient Hospital Stay (HOSPITAL_COMMUNITY): Admission: RE | Admit: 2021-12-31 | Payer: Medicare Other | Source: Ambulatory Visit

## 2021-12-31 ENCOUNTER — Other Ambulatory Visit: Payer: Self-pay

## 2021-12-31 ENCOUNTER — Emergency Department (HOSPITAL_COMMUNITY)
Admission: EM | Admit: 2021-12-31 | Discharge: 2021-12-31 | Disposition: A | Discharge status: Skilled Nursing Facility | Payer: Medicare Other | Attending: Emergency Medicine | Admitting: Emergency Medicine

## 2021-12-31 ENCOUNTER — Encounter (HOSPITAL_COMMUNITY): Payer: Self-pay

## 2021-12-31 DIAGNOSIS — Z431 Encounter for attention to gastrostomy: Secondary | ICD-10-CM

## 2021-12-31 DIAGNOSIS — R6889 Other general symptoms and signs: Secondary | ICD-10-CM

## 2021-12-31 DIAGNOSIS — R633 Feeding difficulties, unspecified: Secondary | ICD-10-CM

## 2021-12-31 DIAGNOSIS — K9423 Gastrostomy malfunction: Secondary | ICD-10-CM | POA: Insufficient documentation

## 2021-12-31 DIAGNOSIS — Z931 Gastrostomy status: Secondary | ICD-10-CM

## 2021-12-31 HISTORY — PX: IR REPLACE G-TUBE SIMPLE WO FLUORO: IMG2323

## 2021-12-31 MED ORDER — IOHEXOL 300 MG/ML  SOLN
30.0000 mL | Freq: Once | INTRAMUSCULAR | Status: AC | PRN
  Administered 2021-12-31: 30 mL

## 2021-12-31 NOTE — ED Provider Notes (Signed)
?[facility] DEPT ?Provider Note ? ? ?CSN: [id number] ?Arrival date & time: 12/31/21  1008 ? ?  ? ?History ? ?Chief Complaint  ?Patient presents with  ? G-Tube Displacement  ? ? ?Dann Magistro is a 88 y.o. female. ? ?88 year old female with prior medical history as detailed below presents for evaluation.  She arrives by EMS from [facility].  Reportedly G-tube was displaced.  Patient was sent by EMS for G-tube replacement. ? ?EMS reports that patient is at baseline. ? ?Patient was reportedly found to have displaced G-tube around 8 AM this morning. ? ?Patient with known prior history of stroke, right-sided deficit, G-tube placement. ? ?The history is provided by the patient, the EMS personnel and medical records.  ?Illness ?Location:  Suspected G-tube displacement ?Severity:  Unable to specify ?Onset quality:  Unable to specify ?Timing:  Unable to specify ?Progression:  Unable to specify ? ?  ? ?Home Medications ?Prior to Admission medications   ?Not on File  ?   ? ?Allergies    ?Patient has no allergy information on record.   ? ?Review of Systems   ?Review of Systems  ?All other systems reviewed and are negative. ? ?Physical Exam ?Updated Vital Signs ?BP (!) 132/98 (BP Location: Left Arm)   Pulse 71   Temp (!) 97.5 ?F (36.4 ?C) (Rectal)   Resp (!) 21   SpO2 92%  ?Physical Exam ?Vitals and nursing note reviewed.  ?Constitutional:   ?   General: She is not in acute distress. ?   Appearance: Normal appearance. She is well-developed.  ?HENT:  ?   Head: Normocephalic and atraumatic.  ?Eyes:  ?   Conjunctiva/sclera: Conjunctivae normal.  ?   Pupils: Pupils are equal, round, and reactive to light.  ?Cardiovascular:  ?   Rate and Rhythm: Normal rate and regular rhythm.  ?   Heart sounds: Normal heart sounds.  ?Pulmonary:  ?   Effort: Pulmonary effort is normal. No respiratory distress.  ?   Breath sounds: Normal breath sounds.  ?Abdominal:  ?   General: There is no distension.  ?   Palpations:  Abdomen is soft.  ?   Tenderness: There is no abdominal tenderness.  ?   Comments: 19 [place] G-tube is in place.  G-tube flushes easily.  There is no significant erythema or signs of cellulitis around G-tube insertion site.  ?Musculoskeletal:     ?   General: No deformity. Normal range of motion.  ?   Cervical back: Normal range of motion and neck supple.  ?Skin: ?   General: Skin is warm and dry.  ? ? ?ED Results / Procedures / Treatments   ?Labs ?(all labs ordered are listed, but only abnormal results are displayed) ?Labs Reviewed - No data to display ? ?EKG ?None ? ?Radiology ?No results found. ? ?Procedures ?Procedures  ? ? ?Medications Ordered in ED ?Medications - No data to display ? ?ED Course/ Medical Decision Making/ A&P ?  ?                        ?Medical Decision Making ?Amount and/or Complexity of Data Reviewed ?Radiology: ordered. ? ?Risk ?Prescription drug management. ? ? ? ?Medical Screen Complete ? ?This patient presented to the ED with complaint of G-tube possibly displaced. ? ?This complaint involves an extensive number of treatment options. The initial differential diagnosis includes, but is not limited to, due to displaced ? ?This presentation is: Acute ? ?Patient  is presenting with complaint of possible misplaced G-tube. ? ?G-tube appears to be appropriate position on exam.   ? ?Placement is confirmed with Gastrografin study. ? ?Patient is appropriate for continued outpatient care. ? ?Additional history obtained: ? ?Additional history obtained from EMS ?External records from outside sources obtained and reviewed including prior ED visits and prior Inpatient records.  ? ?Imaging Studies ordered: ? ?I ordered imaging studies including 1 view of abdomen with Gastrografin infusion through the G-tube ?I independently visualized and interpreted obtained imaging which showed correct G-tube placement ?I agree with the radiologist interpretation. ? ? ?Problem List / ED Course: ? ?Possibly misplaced  G-tube ? ? ?Reevaluation: ? ?After the interventions noted above, I reevaluated the patient and found that they have: resolved ? ? ?Disposition: ? ?After consideration of the diagnostic results and the patients response to treatment, I feel that the patent would benefit from close outpatient follow-up.  ? ? ? ? ? ? ? ? ?Final Clinical Impression(s) / ED Diagnoses ?Final diagnoses:  ?Complaint associated with gastric tube ([place])  ? ? ?Rx / DC Orders ?ED Discharge Orders   ? ? None  ? ?  ? ? ?  ?Valarie Merino, MD ?12/31/21 1208 ? ?

## 2021-12-31 NOTE — ED Notes (Signed)
This nurse spoke with Joy Braun, LPN at [facility] to give report. Joy Norman, denies any questions or concerns at this time. ?

## 2021-12-31 NOTE — ED Notes (Signed)
Pt to imaging at this time.

## 2021-12-31 NOTE — Discharge Instructions (Signed)
Return for any problem. ? ?G-tube is positioned well today. ?

## 2021-12-31 NOTE — ED Notes (Signed)
[facility] paged for transport 

## 2021-12-31 NOTE — ED Triage Notes (Signed)
Pt presents to the ED via EMS from [facility] for a G-tube displacement. Per EMS, pt is currently at her baseline which is inaudible noises and withdraws from pain, with EMS obtaining a GCS of 6 en route. Per EMS, the pt's facility states this is her "baseline." Per EMS, the facility states the pt's G-tube was displaced around 0800 this morning. Ems is uncertain when the initial G-tube as placed. Pt has a known hx of stroke with right-sided deficit. Hx limited and provided by EMS. Pt with a dry cough on arrival to the ED and moaning inaudible noises.  ?

## 2021-12-31 NOTE — ED Notes (Signed)
EDP at the bedside to assess.  ?

## 2021-12-31 NOTE — Procedures (Signed)
Patient seen  in IR holding Room  for gastrostomy tube site replacement procedure. Existing  placeholder (foley cathter)  was deflated and the catheter was easily removed.  New  20 Fr balloon retention gastrostomy tube inserted with out difficulty 9 ml of normal saline instilled in the retention balloon port and the flange repositioned for comfort. Stomach contents noted in the gastrostomy tube.  ? ?Confirmation abdominal xray confirming placement pending. Once confirmed ? ?Please call IR for any questions or concerns regarding the g-tube. ?  ? ?

## 2021-12-31 NOTE — ED Notes (Signed)
[facility] here to take pt  

## 2022-01-03 ENCOUNTER — Other Ambulatory Visit (HOSPITAL_COMMUNITY): Payer: Medicare Other

## 2022-03-18 ENCOUNTER — Other Ambulatory Visit: Payer: Self-pay

## 2022-03-18 ENCOUNTER — Emergency Department (HOSPITAL_COMMUNITY)
Admission: EM | Admit: 2022-03-18 | Discharge: 2022-03-19 | Disposition: A | Discharge status: Skilled Nursing Facility | Payer: Medicare Other | Attending: Emergency Medicine | Admitting: Emergency Medicine

## 2022-03-18 DIAGNOSIS — K9423 Gastrostomy malfunction: Secondary | ICD-10-CM | POA: Diagnosis present

## 2022-03-18 DIAGNOSIS — F0390 Unspecified dementia without behavioral disturbance: Secondary | ICD-10-CM | POA: Insufficient documentation

## 2022-03-18 NOTE — ED Notes (Signed)
This RN was able to flush 60ml of water into PEG tube without any difficulty. No leaking noted from PEG tube as well.

## 2022-03-18 NOTE — ED Provider Notes (Signed)
[facility] EMERGENCY DEPARTMENT Provider Note   CSN: [id number] Arrival date & time: 03/18/22  1944     History  Chief Complaint  Patient presents with   peg tube leaking    Joy Norman is a 88 y.o. female with a history of dementia, CVA, and seizures presenting to the ED with concerns for PEG tube malfunction.  Per EMS, patient's nursing facility stated that the PEG tube is cracked and leaking so they sent her to the ED for replacement of the PEG tube.  Patient is nonverbal at baseline and unable to provide further history.  HPI     Home Medications Prior to Admission medications   Not on File      Allergies    Patient has no allergy information on record.    Review of Systems   Review of Systems  Unable to perform ROS: Patient nonverbal   Physical Exam Updated Vital Signs BP (!) 112/51   Pulse 61   Temp (!) 97.1 F (36.2 C) (Axillary)   Resp 16   SpO2 96%  Physical Exam Vitals and nursing note reviewed.  Constitutional:      General: She is not in acute distress.    Appearance: She is not toxic-appearing or diaphoretic.     Comments: Patient is unresponsive lying in the bed.  Elderly and chronically ill-appearing.  HENT:     Head: Normocephalic and atraumatic.  Cardiovascular:     Rate and Rhythm: Normal rate and regular rhythm.  Pulmonary:     Effort: Pulmonary effort is normal. No respiratory distress.  Abdominal:     Palpations: Abdomen is soft.     Tenderness: There is no abdominal tenderness. There is no guarding or rebound.     Comments: PEG tube in place over the left upper abdomen without surrounding erythema or discharge/purulence.  Musculoskeletal:     Cervical back: Neck supple.  Skin:    General: Skin is warm and dry.  Neurological:     Mental Status: Mental status is at baseline.     Comments: Contracted right upper extremity.    ED Results / Procedures / Treatments   Labs (all labs ordered are listed, but only  abnormal results are displayed) Labs Reviewed - No data to display  EKG None  Radiology No results found.  Procedures Procedures    Medications Ordered in ED Medications - No data to display  ED Course/ Medical Decision Making/ A&P                           Medical Decision Making  Joy Norman is a 88 y.o. female with a history of dementia, CVA, and seizures presenting to the ED with concerns for PEG tube malfunction.  On exam, the patient is afebrile and hemodynamically stable.  At baseline, patient is nonresponsive and nonverbal.  PEG tube is in place over the left upper abdomen and does not have any surrounding erythema or purulence.  At the very end of the PEG tube, it does appear slightly cracked at the distal end.  Her abdomen is soft and nontender to palpation.  The PEG tube flushes easily without issue and appears to be in proper position.  Given the PEG tube is still functioning, I do not think she requires emergent exchange of the tube.  I contacted the patient skilled nursing facility and advised them that the should set up for outpatient PEG tube exchange given that  the PEG tube is still functioning and they are in agreement with this plan.  Strict return precautions were discussed and the patient was discharged back to her facility in stable condition.  Final Clinical Impression(s) / ED Diagnoses Final diagnoses:  Leaking PEG tube [facility])    Rx / DC Orders ED Discharge Orders     None         Laurence Compton, MD 03/18/22 2314    Alvira Monday, MD 03/20/22 1232

## 2022-03-18 NOTE — ED Triage Notes (Signed)
Pt BIB [facility] from [facility]. Per SNF staff, peg tube is "cracked and leaking". Pt nonverbal and at baseline.

## 2022-03-18 NOTE — Discharge Instructions (Signed)
The PEG tube is flushing normally and without difficulty so no need for emergent exchange tonight. She should be scheduled for an outpatient procedure for the PEG tube to be exchanged which can be arranged by her physician or the facility physician.

## 2022-03-19 NOTE — ED Notes (Signed)
Patient verbalizes understanding of d/c instructions. Opportunities for questions and answers were provided. Pt d/c from ED and transported back to SNF via [facility] °

## 2022-04-17 ENCOUNTER — Emergency Department (HOSPITAL_COMMUNITY)
Admission: EM | Admit: 2022-04-17 | Discharge: 2022-04-18 | Disposition: A | Discharge status: Home / Self Care | Payer: Medicare Other | Attending: Emergency Medicine | Admitting: Emergency Medicine

## 2022-04-17 ENCOUNTER — Emergency Department (HOSPITAL_COMMUNITY): Payer: Medicare Other

## 2022-04-17 DIAGNOSIS — K9423 Gastrostomy malfunction: Secondary | ICD-10-CM | POA: Insufficient documentation

## 2022-04-17 DIAGNOSIS — K9420 Gastrostomy complication, unspecified: Secondary | ICD-10-CM

## 2022-04-17 DIAGNOSIS — Z4800 Encounter for change or removal of nonsurgical wound dressing: Secondary | ICD-10-CM | POA: Diagnosis not present

## 2022-04-17 MED ORDER — IOHEXOL 300 MG/ML  SOLN
50.0000 mL | Freq: Once | INTRAMUSCULAR | Status: AC | PRN
  Administered 2022-04-17: 50 mL

## 2022-04-17 NOTE — ED Provider Notes (Signed)
[facility]-EMERGENCY DEPT Provider Note   CSN: [id number] Arrival date & time: 04/17/22  1827     History  No chief complaint on file.   Joy Norman is a 88 y.o. female.  The history is provided by the EMS personnel and medical records (ems run sheet and report from nursing). The history is limited by the condition of the patient. No language interpreter was used.  Wound Check This is a recurrent (G-tube fell out this morning and was replaced by Foley catheter.) problem. The problem occurs rarely. The problem has not changed since onset.Pertinent negatives include no chest pain, no abdominal pain, no headaches and no shortness of breath.   LVL 5 caveat for nonverbal at baseline     Home Medications Prior to Admission medications   Not on File      Allergies    Patient has no allergy information on record.    Review of Systems   Review of Systems  Unable to perform ROS: Patient nonverbal (Patient nonverbal but per EMS report is at her baseline otherwise)  Respiratory:  Negative for shortness of breath.   Cardiovascular:  Negative for chest pain.  Gastrointestinal:  Negative for abdominal pain.  Neurological:  Negative for headaches.    Physical Exam Updated Vital Signs BP (!) 188/99 (BP Location: Left Wrist)   Pulse 72   Temp 98 F (36.7 C) (Axillary)   Resp 15   SpO2 96%  Physical Exam Vitals and nursing note reviewed.  Constitutional:      General: She is not in acute distress.    Appearance: She is well-developed. She is not ill-appearing, toxic-appearing or diaphoretic.  HENT:     Head: Normocephalic and atraumatic.     Mouth/Throat:     Mouth: Mucous membranes are moist.  Cardiovascular:     Rate and Rhythm: Normal rate and regular rhythm.     Heart sounds: No murmur heard. Pulmonary:     Effort: Pulmonary effort is normal. No respiratory distress.     Breath sounds: Normal breath sounds. No wheezing, rhonchi or rales.  Chest:      Chest wall: No tenderness.  Abdominal:     General: Abdomen is flat.     Palpations: Abdomen is soft.     Tenderness: There is no abdominal tenderness. There is no guarding or rebound.     Comments: Foley catheter in her G-tube site.  Musculoskeletal:        General: No swelling or tenderness.     Cervical back: Neck supple. No tenderness.  Skin:    General: Skin is warm and dry.     Capillary Refill: Capillary refill takes less than 2 seconds.     Findings: No erythema.  Neurological:     Mental Status: She is alert. Mental status is at baseline.     Comments: At baseline per EMS report     ED Results / Procedures / Treatments   Labs (all labs ordered are listed, but only abnormal results are displayed) Labs Reviewed - No data to display  EKG None  Radiology DG ABDOMEN PEG TUBE LOCATION  Result Date: 04/17/2022 CLINICAL DATA:  PEG placement. EXAM: ABDOMEN - 1 VIEW COMPARISON:  Abdominal radiograph dated 12/31/2013. FINDINGS: Percutaneous gastrostomy with balloon in the right upper abdomen. Contrast injected in the tube opacifies the distal stomach and proximal small bowel. No extravasation of contrast or evidence of leak. No bowel dilatation. Degenerative changes of the spine. No acute osseous  pathology. IMPRESSION: Percutaneous gastrostomy balloon in the distal stomach. Electronically Signed   By: Elgie Collard M.D.   On: 04/17/2022 20:38    Procedures Procedures    Medications Ordered in ED Medications  iohexol (OMNIPAQUE) 300 MG/ML solution 50 mL (50 mLs Per Tube Contrast Given 04/17/22 2034)    ED Course/ Medical Decision Making/ A&P                           Medical Decision Making Amount and/or Complexity of Data Reviewed Radiology: ordered.  Risk Prescription drug management.    Joy Norman is a 88 y.o. female with a past history significant for dementia with baseline being nonverbal, seizures, and previous stroke who has a G-tube who presents with  G-tube problem.  According to EMS documentation, patient had her G-tube falling out this morning.  It was replaced by a 24 [place] Foley at the facility and they sent her in for management.  Otherwise there was no other preceding symptoms or other complaints and patient is at her mental status baseline by report.  Patient is nonverbal and cannot answer questions but does not appear to be distressed.  On my exam, lungs were clear and chest was nontender.  Abdomen was nontender.  She does not appear in any discomfort.  Patient has a large Foley catheter in her G-tube site without significant bleeding or drainage or erythema.    Due to the Foley being in place does have a G-tube, I called interventional radiology to discuss plan.  They recommended initially we see if we could drop back stomach fluids.  If so, they would recommend leaving it in place and having them call interventional radiology tomorrow to have it exchanged in clinic.  If it did not call back well, they recommended doing a PEG tube image with contrast injection and confirm placement.  If it is in the right spine, they still recommend discharge home rather than try to replace the emergency department here if it is working.  The imaging confirmed appropriate placement with no leak.  Given the patient's lack of distress and a working access, we will leave it in place and have her call interventional radiology tomorrow to get that scheduled.  They would let the team know to expect a call for replacement.  Patient still has no distress and will be discharged for follow-up tomorrow with interventional radiology for placement.          Final Clinical Impression(s) / ED Diagnoses Final diagnoses:  Problem with gastrostomy tube (HCC)    Rx / DC Orders ED Discharge Orders     None      Clinical Impression: 1. Problem with gastrostomy tube [facility])     Disposition: Discharge  Condition: Good  I have discussed the results, Dx  and Tx plan with the pt(& family if present). He/she/they expressed understanding and agree(s) with the plan. Discharge instructions discussed at great length. Strict return precautions discussed and pt &/or family have verbalized understanding of the instructions. No further questions at time of discharge.    New Prescriptions   No medications on file    Follow Up: [facility] INTERVENTIONAL RADIOLOGY [address] [id number] mc [place] [address] [phone]       Devon Kingdon, Canary Brim, MD 04/17/22 2352

## 2022-04-17 NOTE — ED Triage Notes (Signed)
Pt BIBA from [facility]. Pt's g-tube came ut. Facility staff replaced with with a foley catheter.  Pt nonverbal and bedbound at baseline  BP: 168 palapated HR: 74 SPO2: 97

## 2022-04-17 NOTE — Discharge Instructions (Signed)
Your feeding tube fell out earlier today and your facility replace it with a Foley catheter.  We assessed the Foley catheter and it flushed well.  I spoke to interventional radiology who recommended we use dye and assess if it is in the correct position.  The imaging after contrast was placed to confirm that the tube is in the appropriate position in the stomach without any evidence of leaking.  They felt that as there is an appropriate Foley in the correct spot, they would recommend calling tomorrow to schedule a procedure to replace the Foley with the appropriate catheter.  They feel you are safe for discharge home given the working catheter and appropriate placement.  Your exam was otherwise reassuring without significant tenderness and we feel you are safe for discharge home.  Please call them tomorrow to schedule tube replacement.

## 2022-04-17 NOTE — ED Notes (Signed)
[facility] called for pt. Transportation to [facility]

## 2022-04-17 NOTE — ED Notes (Signed)
Report given to [facility] at [facility]

## 2022-04-20 ENCOUNTER — Other Ambulatory Visit (HOSPITAL_COMMUNITY): Payer: Self-pay | Admitting: Nurse Practitioner

## 2022-04-20 DIAGNOSIS — Z431 Encounter for attention to gastrostomy: Secondary | ICD-10-CM

## 2022-04-21 ENCOUNTER — Ambulatory Visit (HOSPITAL_COMMUNITY)
Admission: RE | Admit: 2022-04-21 | Discharge: 2022-04-21 | Disposition: A | Discharge status: Home / Self Care | Payer: Medicare Other | Source: Ambulatory Visit | Attending: Nurse Practitioner | Admitting: Nurse Practitioner

## 2022-04-21 DIAGNOSIS — Z431 Encounter for attention to gastrostomy: Secondary | ICD-10-CM | POA: Insufficient documentation

## 2022-04-21 HISTORY — PX: IR REPLACE G-TUBE SIMPLE WO FLUORO: IMG2323

## 2022-04-21 NOTE — Procedures (Signed)
Pt presents to IR with request for G-tube exchange.  With the assistance of IR tech: Viscous lidocaine was placed around site. New 24 F G-tube was inserted and balloon inflated with 10 cc sterile saline.  Correct position confirmed with return of gastric contents.  Lopez valve placed.  Site dressed with split gauze.  Pt d/c back to facility.     Alex Gardener, AGNP-BC 04/21/2022, 9:38 AM

## 2022-07-31 DEATH — deceased

## 2022-08-05 IMAGING — DX DG CHEST 1V PORT
1 series · 1 of 1 positions shown · non-contrast
Comparison: 06/17/2020

CLINICAL DATA: Respiratory failure.

EXAM:
PORTABLE CHEST 1 VIEW

[chest ap]
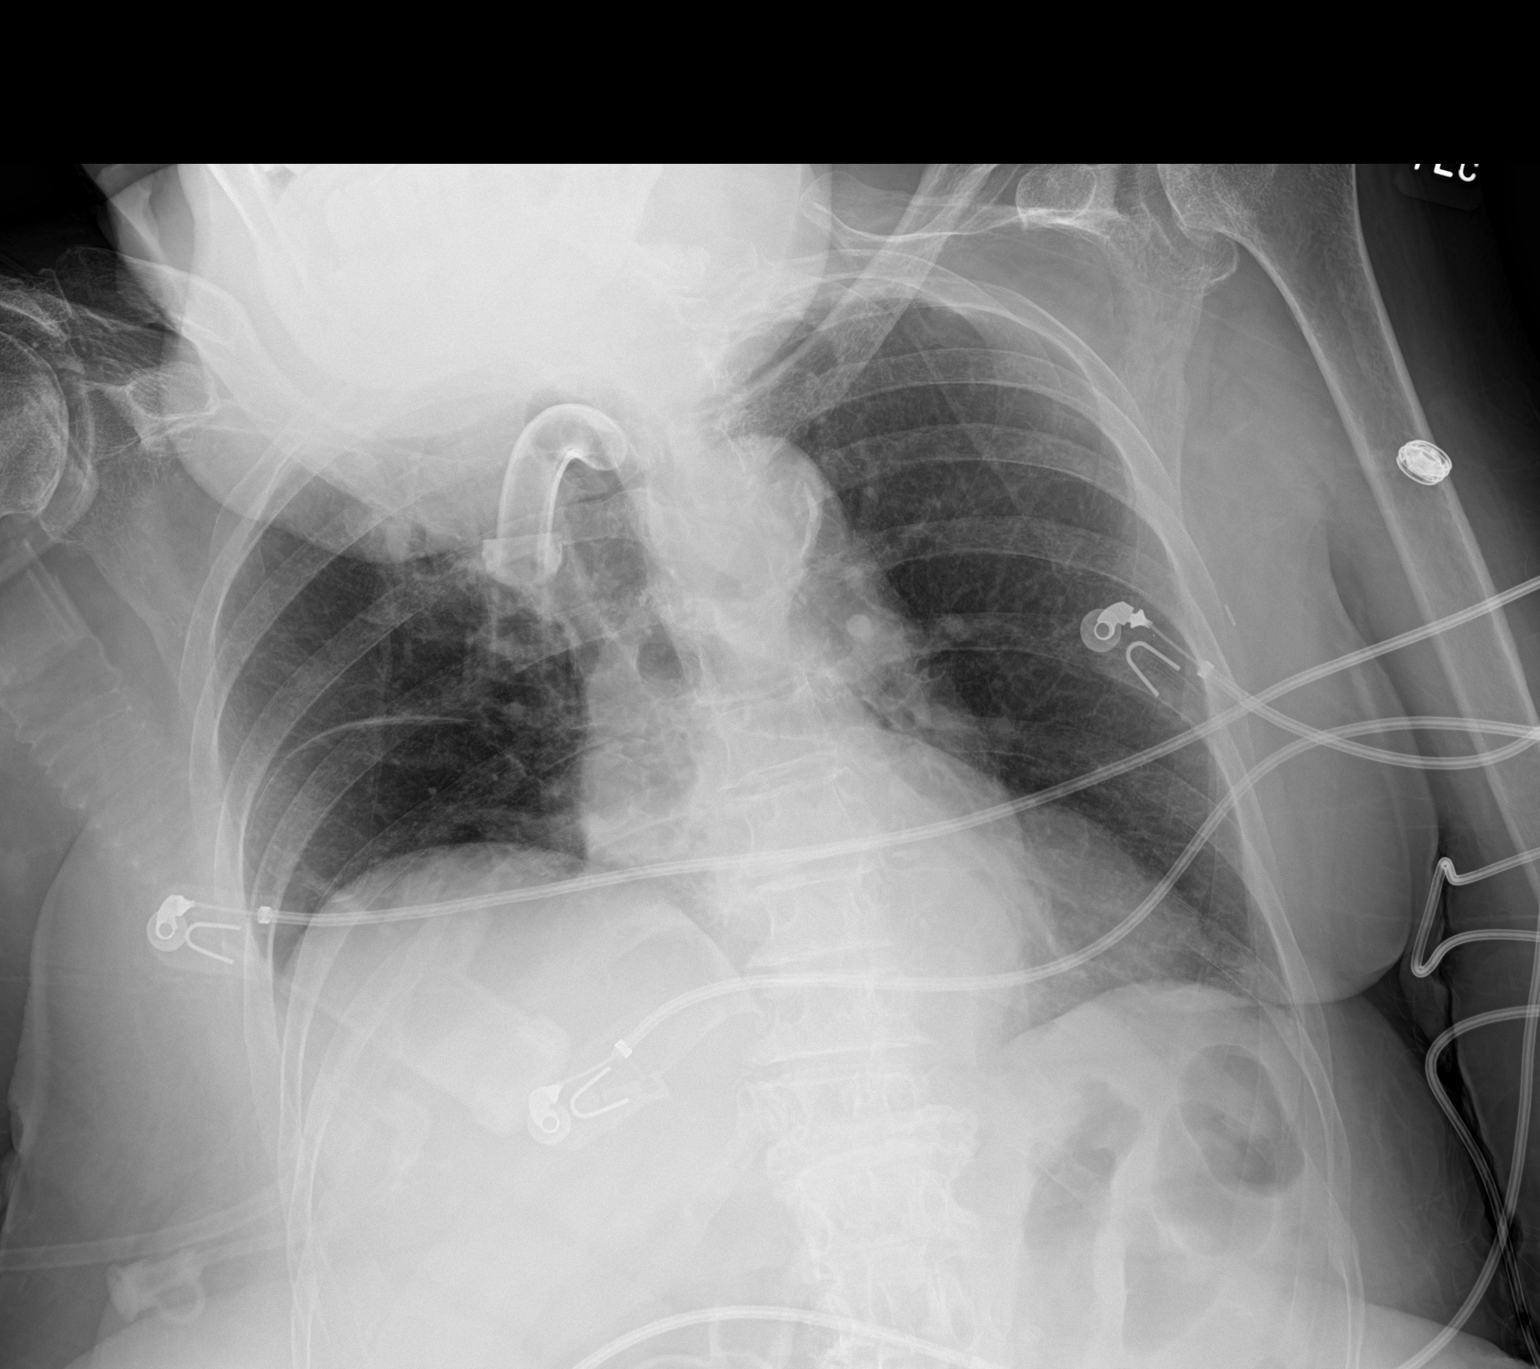

[1 of 1 positions shown; findings below may reference images not displayed]

FINDINGS: Tracheostomy tube tip is identified above the carina. Asymmetric
elevation of the right hemidiaphragm. Heart size is normal. Aortic
atherosclerotic calcifications noted. Hiatal hernia. No pleural
effusion or airspace consolidation.
IMPRESSION: 1. No acute cardiopulmonary abnormalities.
2. Hiatal hernia.
3.  Aortic Atherosclerosis ([id number].M).

## 2022-08-10 IMAGING — DX DG CHEST 1V PORT
1 series · 1 of 1 positions shown · non-contrast
Comparison: 06/22/2020

CLINICAL DATA: Respiratory failure.

EXAM:
PORTABLE CHEST 1 VIEW

[chest ap]
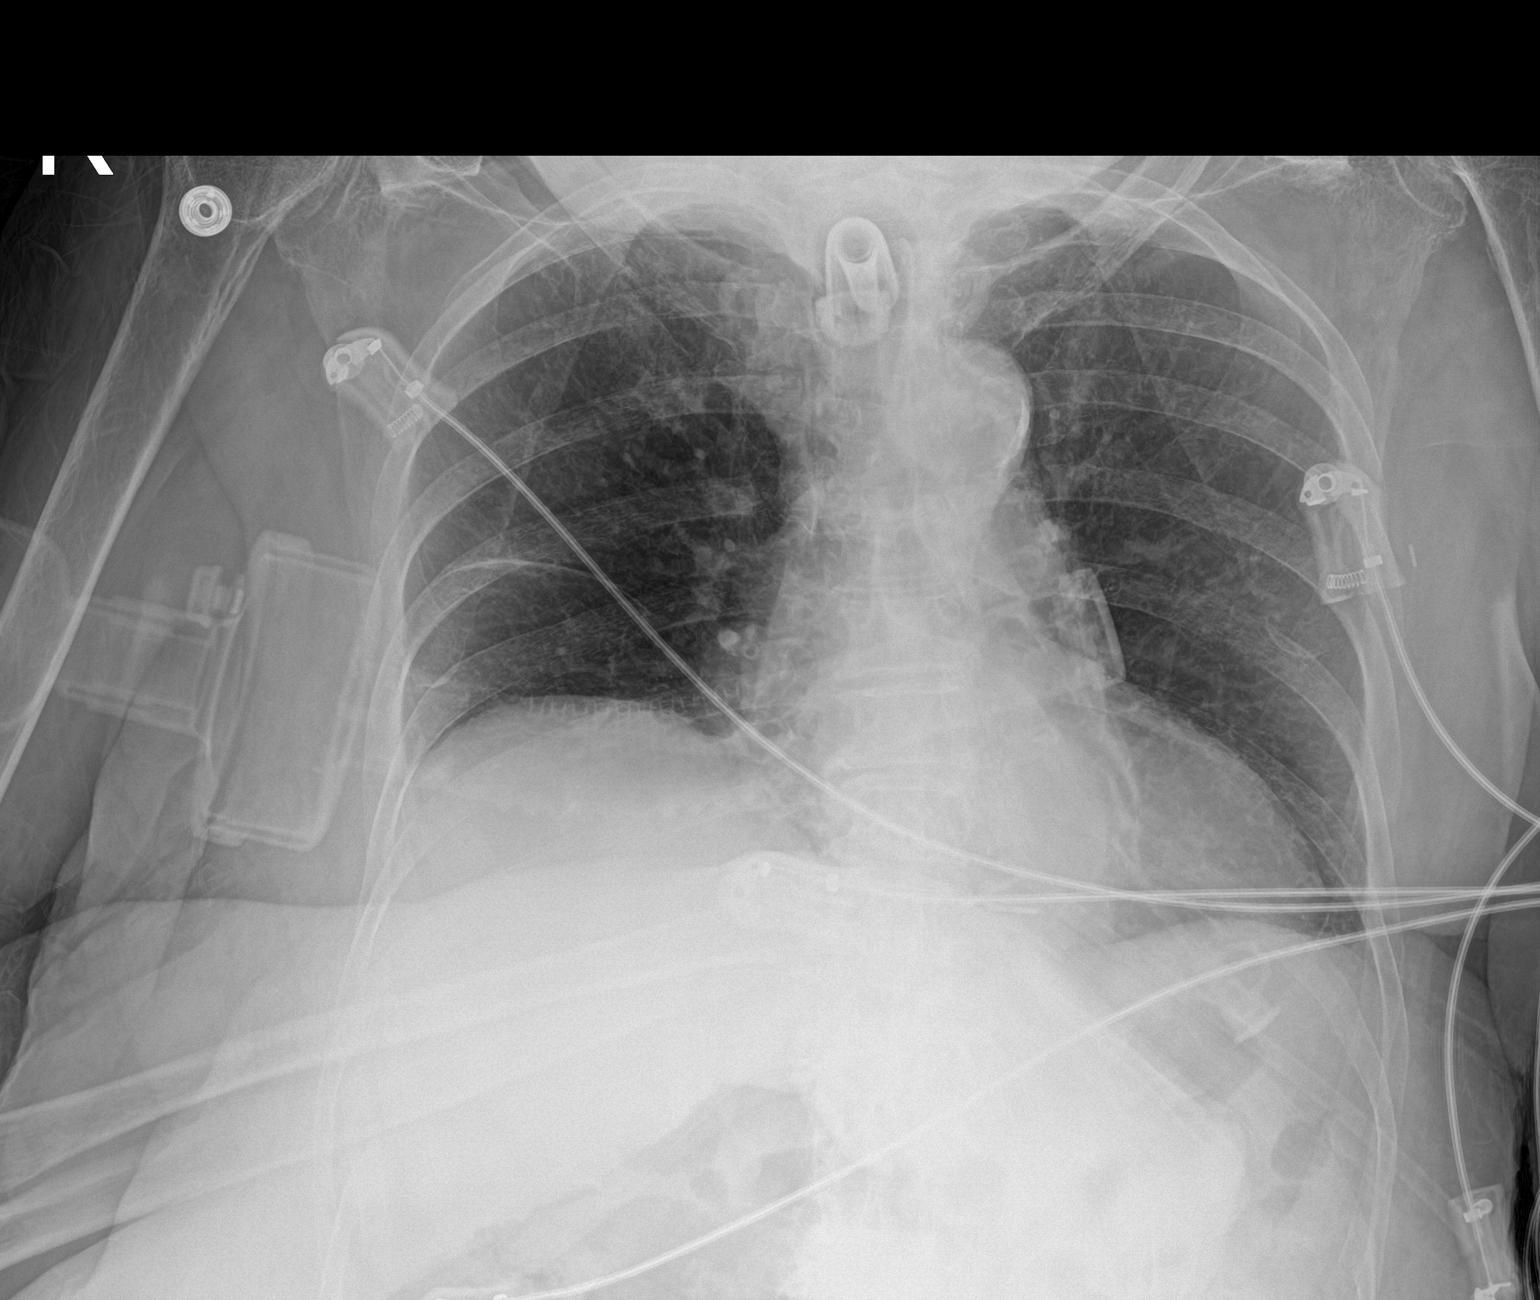

[1 of 1 positions shown; findings below may reference images not displayed]

FINDINGS: Tracheostomy tube tip is above the carina. Atherosclerotic aortic
calcifications noted. Decreased lung volumes with chronic elevation
of the right hemidiaphragm. No pleural effusion or airspace
consolidation. Hiatal hernia is again noted. Advanced arthropathic
changes are noted involving the glenohumeral joints.
IMPRESSION: 1. No acute cardiopulmonary abnormalities.
2. Chronic elevation of the right hemidiaphragm.

## 2024-05-30 IMAGING — DX DG ABDOMEN 1V
1 series · 1 of 1 positions shown · non-contrast
Comparison: Abdominal radiograph dated 12/31/2013.

CLINICAL DATA: PEG placement.

EXAM:
ABDOMEN - 1 VIEW

[abdomen kub]
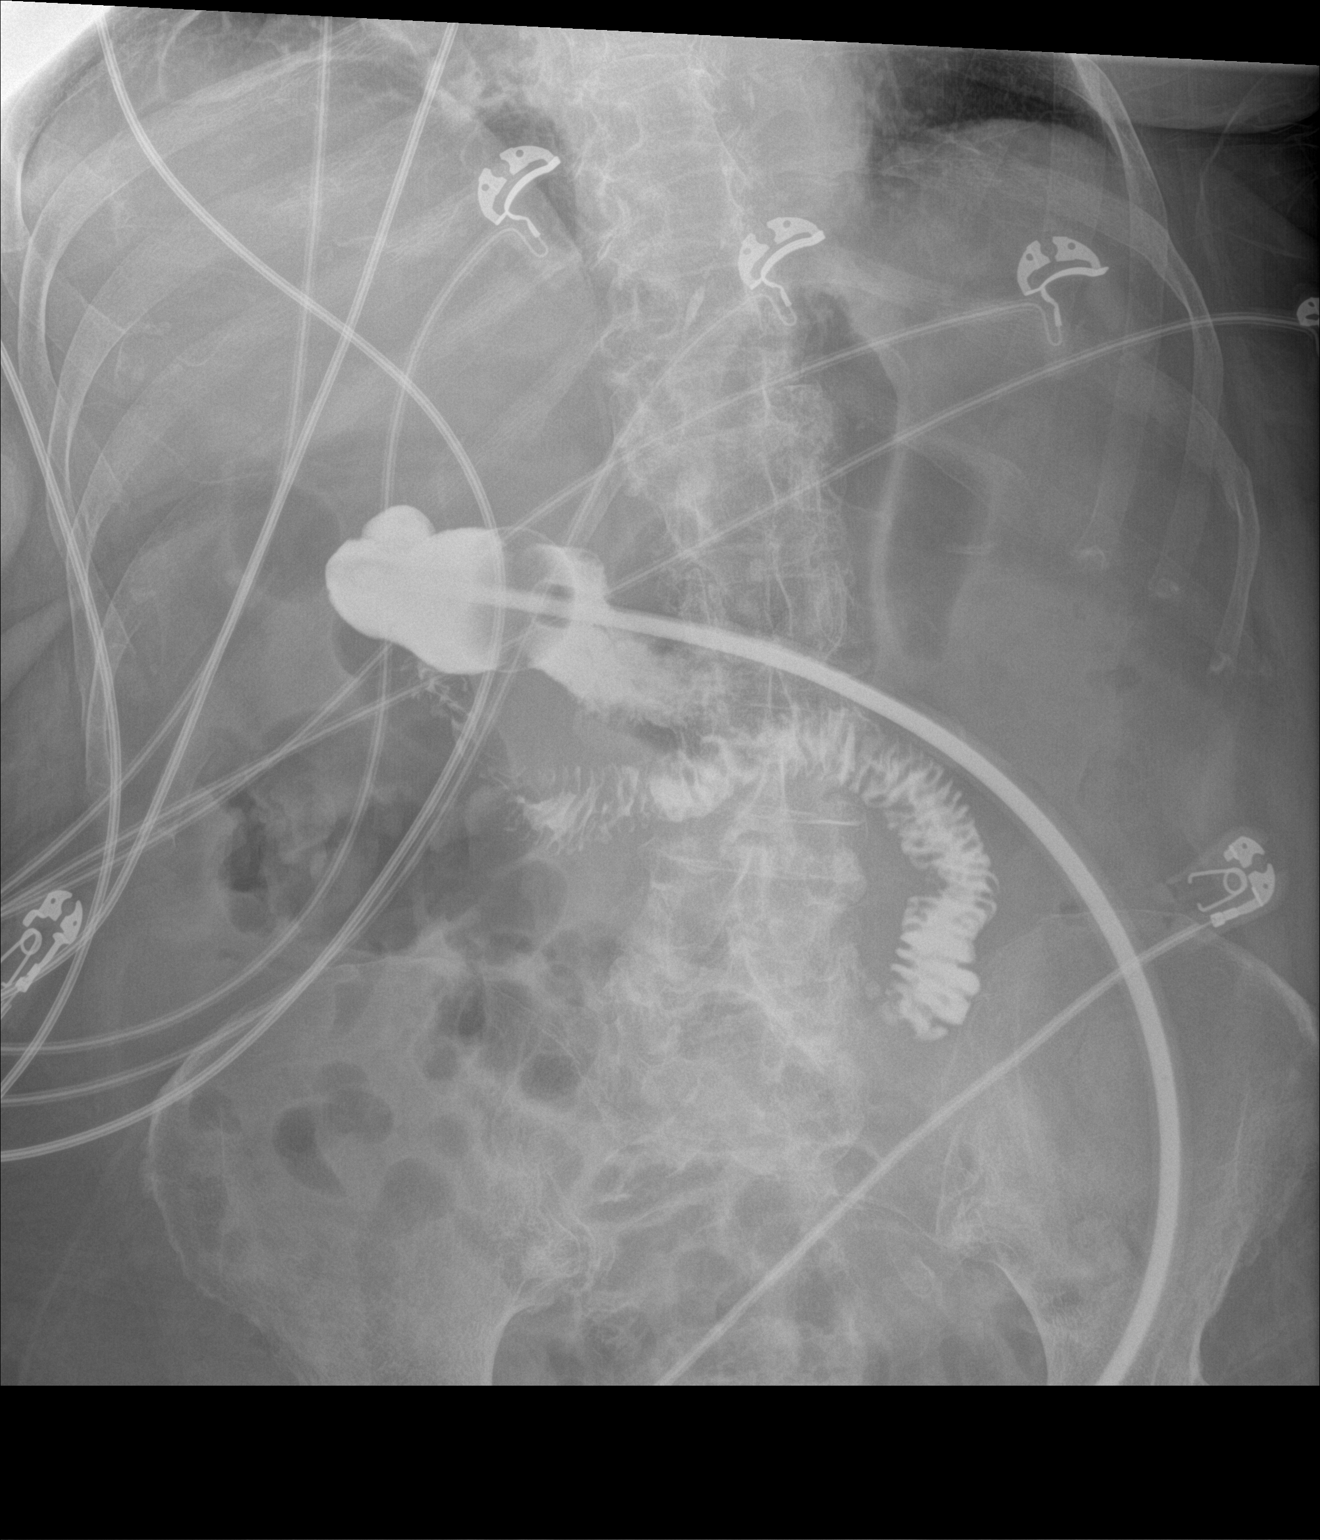

[1 of 1 positions shown; findings below may reference images not displayed]

FINDINGS: Percutaneous gastrostomy with balloon in the right upper abdomen.
Contrast injected in the tube opacifies the distal stomach and
proximal small bowel. No extravasation of contrast or evidence of
leak. No bowel dilatation. Degenerative changes of the spine. No
acute osseous pathology.
IMPRESSION: Percutaneous gastrostomy balloon in the distal stomach.
